# Patient Record
Sex: Female | Born: 1960 | Race: White | Hispanic: No | Marital: Married | Smoking: Former smoker
Health system: Southern US, Community
[De-identification: ages and names within clinical notes are randomized; demographics above are authoritative.]

## PROBLEM LIST (undated history)

## (undated) DIAGNOSIS — E7800 Pure hypercholesterolemia, unspecified: Secondary | ICD-10-CM

## (undated) DIAGNOSIS — R011 Cardiac murmur, unspecified: Secondary | ICD-10-CM

## (undated) DIAGNOSIS — F419 Anxiety disorder, unspecified: Secondary | ICD-10-CM

## (undated) HISTORY — PX: OTHER SURGICAL HISTORY: SHX169

## (undated) HISTORY — DX: Cardiac murmur, unspecified: R01.1

## (undated) HISTORY — DX: Anxiety disorder, unspecified: F41.9

## (undated) HISTORY — PX: COLONOSCOPY: SHX174

## (undated) HISTORY — DX: Pure hypercholesterolemia, unspecified: E78.00

---

## 2020-03-14 ENCOUNTER — Other Ambulatory Visit: Payer: Self-pay | Admitting: Internal Medicine

## 2020-03-14 DIAGNOSIS — Z13820 Encounter for screening for osteoporosis: Secondary | ICD-10-CM

## 2020-10-16 ENCOUNTER — Other Ambulatory Visit (HOSPITAL_BASED_OUTPATIENT_CLINIC_OR_DEPARTMENT_OTHER): Payer: Self-pay | Admitting: Obstetrics & Gynecology

## 2020-10-16 DIAGNOSIS — Z1231 Encounter for screening mammogram for malignant neoplasm of breast: Secondary | ICD-10-CM

## 2020-10-25 ENCOUNTER — Ambulatory Visit (HOSPITAL_BASED_OUTPATIENT_CLINIC_OR_DEPARTMENT_OTHER)
Admission: RE | Admit: 2020-10-25 | Discharge: 2020-10-25 | Disposition: A | Discharge status: Home / Self Care | Payer: 59 | Source: Ambulatory Visit | Attending: Obstetrics & Gynecology | Admitting: Obstetrics & Gynecology

## 2020-10-25 ENCOUNTER — Other Ambulatory Visit: Payer: Self-pay

## 2020-10-25 DIAGNOSIS — Z1231 Encounter for screening mammogram for malignant neoplasm of breast: Secondary | ICD-10-CM | POA: Diagnosis present

## 2021-01-11 ENCOUNTER — Other Ambulatory Visit (HOSPITAL_COMMUNITY)
Admission: RE | Admit: 2021-01-11 | Discharge: 2021-01-11 | Disposition: A | Discharge status: Home / Self Care | Payer: 59 | Source: Ambulatory Visit | Attending: Obstetrics & Gynecology | Admitting: Obstetrics & Gynecology

## 2021-01-11 ENCOUNTER — Other Ambulatory Visit: Payer: Self-pay

## 2021-01-11 ENCOUNTER — Ambulatory Visit (HOSPITAL_BASED_OUTPATIENT_CLINIC_OR_DEPARTMENT_OTHER): Payer: 59 | Admitting: Obstetrics & Gynecology

## 2021-01-11 ENCOUNTER — Encounter (HOSPITAL_BASED_OUTPATIENT_CLINIC_OR_DEPARTMENT_OTHER): Payer: Self-pay | Admitting: Obstetrics & Gynecology

## 2021-01-11 VITALS — BP 137/91 | HR 60 | Ht 63.25 in | Wt 129.0 lb

## 2021-01-11 DIAGNOSIS — E2839 Other primary ovarian failure: Secondary | ICD-10-CM | POA: Diagnosis not present

## 2021-01-11 DIAGNOSIS — Z124 Encounter for screening for malignant neoplasm of cervix: Secondary | ICD-10-CM

## 2021-01-11 DIAGNOSIS — N952 Postmenopausal atrophic vaginitis: Secondary | ICD-10-CM

## 2021-01-11 DIAGNOSIS — Z780 Asymptomatic menopausal state: Secondary | ICD-10-CM

## 2021-01-11 DIAGNOSIS — Z01419 Encounter for gynecological examination (general) (routine) without abnormal findings: Secondary | ICD-10-CM

## 2021-01-11 DIAGNOSIS — L719 Rosacea, unspecified: Secondary | ICD-10-CM | POA: Insufficient documentation

## 2021-01-11 NOTE — Patient Instructions (Addendum)
Could you get me the dates for your shingles vaccination, Tdap, and Covid vaccination?  Thank you.  Adults need to be tested for Hepatitis C and HIV completed.    Raveree

## 2021-01-11 NOTE — Progress Notes (Signed)
60 y.o. G2P0 Married White or Caucasian female here for annual exam/new patient.  Moved to [place] two years ago.  Husband worked with Pensions [employer] and [employer] and just retired.    Denies vaginal bleeding.    PCP:  Dr. Jacalyn Lefevre.  Last blood work was about a year ago.  Apt due in August.   No LMP recorded. Patient is postmenopausal.          Sexually active: Yes.    The current method of family planning is post menopausal status.    Exercising: yes, elliptical and weights   Smoker:  no  Health Maintenance: Pap:  Obtained today History of abnormal Pap:  no MMG:  10/2020 Colonoscopy:  Will get release BMD:   Order placed TDaP:  She is going to see if has date for last one Shingrix:   completed Hep C testing: recommended Screening Labs: Dr. Jacalyn Lefevre   reports that she has quit smoking. She has never used smokeless tobacco. She reports previous alcohol use. She reports that she does not use drugs.  Past Medical History:  Diagnosis Date  . High cholesterol     Past Surgical History:  Procedure Laterality Date  . Birth mark removal    . Ovarian cyst removal      Current Outpatient Medications  Medication Sig Dispense Refill  . PAROXETINE HCL PO Take 15 mg by mouth.     No current facility-administered medications for this visit.    Family History  Problem Relation Age of Onset  . Hyperlipidemia Mother   . Hyperlipidemia Father   . Cancer Father   . Melanoma Father   . Prostate cancer Father     Review of Systems  All other systems reviewed and are negative.  Exam:   BP (!) 137/91   Pulse 60   Ht 5' 3.25" (1.607 m)   Wt 129 lb (58.5 kg)   BMI 22.67 kg/m   Height: 5' 3.25" (160.7 cm)  General appearance: alert, cooperative and appears stated age Head: Normocephalic, without obvious abnormality, atraumatic Neck: no adenopathy, supple, symmetrical, trachea midline and thyroid normal to inspection and palpation Lungs: clear to auscultation bilaterally Breasts: normal appearance,  no masses or tenderness Heart: regular rate and rhythm Abdomen: soft, non-tender; bowel sounds normal; no masses,  no organomegaly Extremities: extremities normal, atraumatic, no cyanosis or edema Skin: Skin color, texture, turgor normal. No rashes or lesions Lymph nodes: Cervical, supraclavicular, and axillary nodes normal. No abnormal inguinal nodes palpated Neurologic: Grossly normal   Pelvic: External genitalia:  no lesions              Urethra:  normal appearing urethra with no masses, tenderness or lesions              Bartholins and Skenes: normal                 Vagina: atrophic tissue, no discharge, no lesions              Cervix: no lesions              Pap taken: No. Bimanual Exam:  Uterus:  normal size, contour, position, consistency, mobility, non-tender              Adnexa: normal adnexa and no mass, fullness, tenderness               Rectovaginal: Confirms               Anus:  normal sphincter  tone, no lesions  Chaperone, Octaviano Batty, CMA, was present for exam.  Assessment/Plan: 1. Well woman exam with routine gynecological exam - pap smear with HR HPV obtained today - MMG 10/2020 - BMD order placed - release for colonoscopy signed - vaccines reviewed.  Pt is going to try and get me dates of vaccinations if she can find - lab work done with Dr. Jacalyn Lefevre.  Pt aware I do not have access to these results as office is on different EMR.  2. Hypoestrogenism - DG BONE DENSITY (DXA); Future  3. Vaginal atrophy - treatment options reviewed.  Pt not interested in any prescription options at this time but may try some OTC choices.

## 2021-01-12 DIAGNOSIS — E2839 Other primary ovarian failure: Secondary | ICD-10-CM | POA: Insufficient documentation

## 2021-01-12 DIAGNOSIS — Z780 Asymptomatic menopausal state: Secondary | ICD-10-CM | POA: Insufficient documentation

## 2021-01-12 DIAGNOSIS — N952 Postmenopausal atrophic vaginitis: Secondary | ICD-10-CM | POA: Insufficient documentation

## 2021-01-12 LAB — CYTOLOGY - PAP
Comment: NEGATIVE
Diagnosis: NEGATIVE
High risk HPV: NEGATIVE

## 2021-01-17 ENCOUNTER — Encounter (HOSPITAL_BASED_OUTPATIENT_CLINIC_OR_DEPARTMENT_OTHER): Payer: Self-pay

## 2021-04-12 ENCOUNTER — Other Ambulatory Visit: Payer: Self-pay | Admitting: Internal Medicine

## 2021-04-12 DIAGNOSIS — Z13820 Encounter for screening for osteoporosis: Secondary | ICD-10-CM

## 2021-05-07 ENCOUNTER — Encounter: Payer: Self-pay | Admitting: Internal Medicine

## 2021-05-25 ENCOUNTER — Ambulatory Visit: Payer: Self-pay

## 2021-05-25 ENCOUNTER — Ambulatory Visit: Payer: 59 | Admitting: Orthopedic Surgery

## 2021-05-25 ENCOUNTER — Other Ambulatory Visit: Payer: Self-pay

## 2021-05-25 DIAGNOSIS — M25561 Pain in right knee: Secondary | ICD-10-CM

## 2021-05-26 ENCOUNTER — Encounter: Payer: Self-pay | Admitting: Orthopedic Surgery

## 2021-05-26 NOTE — Progress Notes (Signed)
Office Visit Note   Patient: Morgan Jones           Date of Birth: Apr 11, 1961           MRN: [id number] Visit Date: 05/25/2021 Requested by: Michael Boston, MD [address] [place],  [place] [address] PCP: Michael Boston, MD  Subjective: Chief Complaint  Patient presents with   Right Knee - Pain    HPI: [name] is an active 60 year old patient with right knee pain.  This is been going on for 2 weeks.  Started about 3 weeks ago.  Had right knee hyperextension/collapse type injury.  Could only walk by keeping her leg straight.  Overall the knee has gradually improved in terms of flexion and ambulatory ability.  She did play tennis on Monday and Tuesday of this week but really had difficulty running.  Localizes the pain posterior and lateral.  Denies any swelling or mechanical symptoms or instability.  Takes Advil occasionally.  She has been able to go to the gym 2 or 3 times since the injury and is able to do the elliptical.  It is difficult for her to run and pivot.              ROS: All systems reviewed are negative as they relate to the chief complaint within the history of present illness.  Patient denies  fevers or chills.   Assessment & Plan: Visit Diagnoses:  1. Acute pain of right knee     Plan: Impression is right knee pain with normal radiographs no effusion on exam stable collateral cruciate ligaments and no Baker's cyst ruptured or intact by ultrasound examination.  In general I think she has had some type of event which has injured her knee.  Structurally the knee looks to be intact and it is improving.  I would favor continued observation with gradual return to regular activity avoiding loaded flexion in the knee beyond 90 degrees.  If symptoms have not improved by 50 to 75% in another 3 weeks I think MRI scan would be indicated.  She will call to get that set up if that is the case.  Follow-Up Instructions: No follow-ups on file.   Orders:  Orders Placed This Encounter  Procedures    XR KNEE 3 VIEW RIGHT   No orders of the defined types were placed in this encounter.     Procedures: No procedures performed   Clinical Data: No additional findings.  Objective: Vital Signs: There were no vitals taken for this visit.  Physical Exam:   Constitutional: Patient appears well-developed HEENT:  Head: Normocephalic Eyes:EOM are normal Neck: Normal range of motion Cardiovascular: Normal rate Pulmonary/chest: Effort normal Neurologic: Patient is alert Skin: Skin is warm Psychiatric: Patient has normal mood and affect   Ortho Exam: Ortho exam demonstrates full active and passive range of motion of the right knee.  No effusion.  Collateral cruciate ligaments are stable.  No medial or lateral joint line tenderness.  Extensor mechanism is intact.  No groin pain with internal ex rotation of the leg.  Patient has excellent muscle tone in both legs.  Pedal pulses palpable.  No coarse grinding or crepitus with range of motion of the knee.  No Baker's cyst by ultrasound examination in the posterior aspect of the knee.  Specialty Comments:  No specialty comments available.  Imaging: No results found.   PMFS History: Patient Active Problem List   Diagnosis Date Noted   Vaginal atrophy 01/12/2021  Postmenopausal 01/12/2021   Rosacea 01/11/2021   Past Medical History:  Diagnosis Date   High cholesterol     Family History  Problem Relation Age of Onset   Hyperlipidemia Mother    Hyperlipidemia Father    Cancer Father    Melanoma Father    Prostate cancer Father     Past Surgical History:  Procedure Laterality Date   Birth mark removal     Ovarian cyst removal     Social History   Occupational History   Not on file  Tobacco Use   Smoking status: Former   Smokeless tobacco: Never  Vaping Use   Vaping Use: Never used  Substance and Sexual Activity   Alcohol use: Not Currently   Drug use: Never   Sexual activity: Yes    Birth control/protection:  None

## 2021-05-29 ENCOUNTER — Ambulatory Visit: Payer: 59 | Admitting: Orthopaedic Surgery

## 2021-06-04 ENCOUNTER — Other Ambulatory Visit: Payer: Self-pay

## 2021-06-04 DIAGNOSIS — M25561 Pain in right knee: Secondary | ICD-10-CM

## 2021-06-04 NOTE — Progress Notes (Signed)
MRI of right knee has been ordered per patient's request.

## 2021-06-18 ENCOUNTER — Ambulatory Visit
Admission: RE | Admit: 2021-06-18 | Discharge: 2021-06-18 | Disposition: A | Discharge status: Home / Self Care | Payer: 59 | Source: Ambulatory Visit | Attending: Orthopedic Surgery | Admitting: Orthopedic Surgery

## 2021-06-18 DIAGNOSIS — M25561 Pain in right knee: Secondary | ICD-10-CM

## 2021-06-19 NOTE — Progress Notes (Signed)
I called her will see in am

## 2021-06-20 ENCOUNTER — Other Ambulatory Visit: Payer: Self-pay

## 2021-06-20 ENCOUNTER — Ambulatory Visit: Payer: 59 | Admitting: Orthopedic Surgery

## 2021-06-20 ENCOUNTER — Encounter: Payer: Self-pay | Admitting: Orthopedic Surgery

## 2021-06-20 DIAGNOSIS — S83261D Peripheral tear of lateral meniscus, current injury, right knee, subsequent encounter: Secondary | ICD-10-CM | POA: Diagnosis not present

## 2021-06-20 NOTE — Progress Notes (Signed)
Office Visit Note   Patient: Morgan Jones           Date of Birth: Dec 27, 1960           MRN: [id number] Visit Date: 06/20/2021 Requested by: Michael Boston, MD [address] [place],  [place] [address] PCP: Michael Boston, MD  Subjective: Chief Complaint  Patient presents with   Right Knee - Follow-up    Scan review    HPI: [name] is a 60 year old patient with right knee pain.  She is about 2 months out from an injury which was a hyperextension type injury.  She has improved some but really cannot resume the types of activities that she would like to do.  She reports aggravating type pain in her knee after about 2 miles of walking.  Also reports difficulty going up and down stairs.  Localizes the pain around both the medial and lateral aspect of the knee.  Does not report any instability or locking symptoms.  She has been doing exercises in the gym and is overall a very fit and active person.              ROS: All systems reviewed are negative as they relate to the chief complaint within the history of present illness.  Patient denies  fevers or chills.   Assessment & Plan: Visit Diagnoses:  1. Peripheral tear of lateral meniscus of right knee as current injury, subsequent encounter     Plan: Impression is meniscal capsular separation over about a 2 cm area in the lateral meniscus of the right knee.  No effusion today.  This is a type of meniscal problem that is typically not degenerative in nature but more traumatic in nature.  I think it is most likely associated with her hyperextension injury 2 months ago.  Has not really improved to the point where she can resume her full desired regimen of activities.  Still is having aggravating type symptoms in the knee.  The rest of her knee looks reasonable in terms of ACL PCL and medial meniscus.  Discussed today operative and nonoperative options for this.  There is still fluid between the periphery of the meniscus and the capsule indicating that there  has not been any healing in this area.  I think the meniscocapsular separation is about 90% complete but not fully complete which has given that meniscus some stability.  Regarding treatment observation would be 1 option to see if it improves on its own.  I would not recommend any type of resection based on the fact that this involves essentially the entire meniscal width.  This is 1 that I think would do best with inside-out meniscal repair.  Risk and benefits of that as well as the extended rehabilitation is discussed.  I think in general she could be doing some cutting and pivoting activities around the 3 to 4 months time mark.  Nonweightbearing for the first 2 weeks then weightbearing in extension for the second 2 weeks.  She is going to consider her options but in general she sounds like she wants to get this done relatively quickly after Thanksgiving.  All questions answered.  Follow-Up Instructions: Return if symptoms worsen or fail to improve.   Orders:  No orders of the defined types were placed in this encounter.  No orders of the defined types were placed in this encounter.     Procedures: No procedures performed   Clinical Data: No additional findings.  Objective: Vital Signs:  There were no vitals taken for this visit.  Physical Exam:   Constitutional: Patient appears well-developed HEENT:  Head: Normocephalic Eyes:EOM are normal Neck: Normal range of motion Cardiovascular: Normal rate Pulmonary/chest: Effort normal Neurologic: Patient is alert Skin: Skin is warm Psychiatric: Patient has normal mood and affect   Ortho Exam: Ortho exam demonstrates full active and passive range of motion of the knee on the right-hand side with no effusion.  No groin pain with internal ex rotation of the leg.  No masses lymphadenopathy or skin changes noted in that right leg area.  Ankle dorsiflexion intact.  Pedal pulses intact.  No focal joint line tenderness but in general most of her  pain is primarily posterior lateral as opposed to posterior medial.  Specialty Comments:  No specialty comments available.  Imaging: No results found.   PMFS History: Patient Active Problem List   Diagnosis Date Noted   Vaginal atrophy 01/12/2021   Postmenopausal 01/12/2021   Rosacea 01/11/2021   Past Medical History:  Diagnosis Date   High cholesterol     Family History  Problem Relation Age of Onset   Hyperlipidemia Mother    Hyperlipidemia Father    Cancer Father    Melanoma Father    Prostate cancer Father     Past Surgical History:  Procedure Laterality Date   Birth mark removal     Ovarian cyst removal     Social History   Occupational History   Not on file  Tobacco Use   Smoking status: Former   Smokeless tobacco: Never  Vaping Use   Vaping Use: Never used  Substance and Sexual Activity   Alcohol use: Not Currently   Drug use: Never   Sexual activity: Yes    Birth control/protection: None

## 2021-06-28 ENCOUNTER — Encounter: Payer: Self-pay | Admitting: Internal Medicine

## 2021-07-11 ENCOUNTER — Encounter: Payer: 59 | Admitting: Internal Medicine

## 2021-07-16 ENCOUNTER — Encounter: Payer: 59 | Admitting: Internal Medicine

## 2021-07-22 HISTORY — PX: MENISCUS REPAIR: SHX5179

## 2021-07-23 ENCOUNTER — Other Ambulatory Visit: Payer: Self-pay | Admitting: Surgical

## 2021-07-23 DIAGNOSIS — S83281A Other tear of lateral meniscus, current injury, right knee, initial encounter: Secondary | ICD-10-CM

## 2021-07-23 MED ORDER — CELECOXIB 100 MG PO CAPS: 100.0000 mg | ORAL_CAPSULE | Freq: Two times a day (BID) | ORAL | 0 refills | Status: DC

## 2021-07-23 MED ORDER — METHOCARBAMOL 500 MG PO TABS: 500.0000 mg | ORAL_TABLET | Freq: Three times a day (TID) | ORAL | 0 refills | Status: DC | PRN

## 2021-07-23 MED ORDER — ASPIRIN 81 MG PO CHEW: 81.0000 mg | CHEWABLE_TABLET | Freq: Every day | ORAL | 0 refills | Status: AC

## 2021-07-23 MED ORDER — HYDROCODONE-ACETAMINOPHEN 5-325 MG PO TABS: 1.0000 | ORAL_TABLET | ORAL | 0 refills | Status: DC | PRN

## 2021-07-30 ENCOUNTER — Ambulatory Visit (INDEPENDENT_AMBULATORY_CARE_PROVIDER_SITE_OTHER): Payer: 59 | Admitting: Orthopedic Surgery

## 2021-07-30 ENCOUNTER — Other Ambulatory Visit: Payer: Self-pay

## 2021-07-30 DIAGNOSIS — S83261D Peripheral tear of lateral meniscus, current injury, right knee, subsequent encounter: Secondary | ICD-10-CM

## 2021-08-02 ENCOUNTER — Encounter: Payer: Self-pay | Admitting: Orthopedic Surgery

## 2021-08-02 NOTE — Progress Notes (Signed)
° °  Post-Op Visit Note   Patient: Morgan Jones           Date of Birth: 1961-06-05           MRN: [id number] Visit Date: 07/30/2021 PCP: Michael Boston, MD   Assessment & Plan:  Chief Complaint:  Chief Complaint  Patient presents with   Right Knee - Routine Post Op   Visit Diagnoses:  1. Peripheral tear of lateral meniscus of right knee as current injury, subsequent encounter     Plan: Morgan Jones is a 60 year old patient who is now a week out right knee arthroscopy with lateral meniscal repair inside out.  Not taking too much pain medicine.  She has been nonweightbearing with the leg in extension.  Leg moves easily today.  Only trace effusion.  No calf tenderness with negative Homans.  Plan is DC sutures and Steri-Strip.  Continue with aspirin daily.  Okay to weight-bear in extension.  No flexion past 90.  Photos from surgery reviewed.  She is going to transition from nonweightbearing to weightbearing in extension.  She has enough quad strength that she could choose to weight-bear without the knee immobilizer likely after a week of transitional weightbearing.  Follow-up in about 2 weeks for clinical recheck.  Follow-Up Instructions: No follow-ups on file.   Orders:  No orders of the defined types were placed in this encounter.  No orders of the defined types were placed in this encounter.   Imaging: No results found.  PMFS History: Patient Active Problem List   Diagnosis Date Noted   Vaginal atrophy 01/12/2021   Postmenopausal 01/12/2021   Rosacea 01/11/2021   Past Medical History:  Diagnosis Date   High cholesterol     Family History  Problem Relation Age of Onset   Hyperlipidemia Mother    Hyperlipidemia Father    Cancer Father    Melanoma Father    Prostate cancer Father     Past Surgical History:  Procedure Laterality Date   Birth mark removal     Ovarian cyst removal     Social History   Occupational History   Not on file  Tobacco Use   Smoking status: Former    Smokeless tobacco: Never  Vaping Use   Vaping Use: Never used  Substance and Sexual Activity   Alcohol use: Not Currently   Drug use: Never   Sexual activity: Yes    Birth control/protection: None

## 2021-08-09 ENCOUNTER — Ambulatory Visit (INDEPENDENT_AMBULATORY_CARE_PROVIDER_SITE_OTHER): Payer: 59 | Admitting: Orthopedic Surgery

## 2021-08-09 ENCOUNTER — Other Ambulatory Visit: Payer: Self-pay

## 2021-08-09 DIAGNOSIS — S83261D Peripheral tear of lateral meniscus, current injury, right knee, subsequent encounter: Secondary | ICD-10-CM

## 2021-08-13 ENCOUNTER — Encounter: Payer: Self-pay | Admitting: Orthopedic Surgery

## 2021-08-13 NOTE — Progress Notes (Signed)
° °  Post-Op Visit Note   Patient: Cleva Camero           Date of Birth: 10-23-1960           MRN: [id number] Visit Date: 08/09/2021 PCP: Michael Boston, MD   Assessment & Plan:  Chief Complaint:  Chief Complaint  Patient presents with   Right Knee - Routine Post Op   Visit Diagnoses:  1. Peripheral tear of lateral meniscus of right knee as current injury, subsequent encounter     Plan: Jimia is a 60 year old patient who is now 3 weeks out right knee meniscal repair on the lateral side.  She has been weightbearing in the knee immobilizer.  On exam no calf tenderness negative Homans.  No effusion.  Incision looks very good.  At this time I want her to discontinue the immobilizer on Sunday.  Okay for flexion but not beyond 90 degrees.  3-week return to initiate formal physical therapy for quad strengthening.  That will be her 6-week mark and we will start having her do more resistive exercises  while avoiding loaded knee flexion past 90 degrees of flexion.  Follow-Up Instructions: Return in about 3 weeks (around 08/30/2021).   Orders:  No orders of the defined types were placed in this encounter.  No orders of the defined types were placed in this encounter.   Imaging: No results found.  PMFS History: Patient Active Problem List   Diagnosis Date Noted   Vaginal atrophy 01/12/2021   Postmenopausal 01/12/2021   Rosacea 01/11/2021   Past Medical History:  Diagnosis Date   High cholesterol     Family History  Problem Relation Age of Onset   Hyperlipidemia Mother    Hyperlipidemia Father    Cancer Father    Melanoma Father    Prostate cancer Father     Past Surgical History:  Procedure Laterality Date   Birth mark removal     Ovarian cyst removal     Social History   Occupational History   Not on file  Tobacco Use   Smoking status: Former   Smokeless tobacco: Never  Vaping Use   Vaping Use: Never used  Substance and Sexual Activity   Alcohol use: Not Currently    Drug use: Never   Sexual activity: Yes    Birth control/protection: None

## 2021-08-19 ENCOUNTER — Other Ambulatory Visit: Payer: Self-pay | Admitting: Surgical

## 2021-09-03 ENCOUNTER — Encounter: Payer: Self-pay | Admitting: Orthopedic Surgery

## 2021-09-03 ENCOUNTER — Ambulatory Visit (INDEPENDENT_AMBULATORY_CARE_PROVIDER_SITE_OTHER): Payer: 59 | Admitting: Orthopedic Surgery

## 2021-09-03 ENCOUNTER — Other Ambulatory Visit: Payer: Self-pay

## 2021-09-03 DIAGNOSIS — S83261D Peripheral tear of lateral meniscus, current injury, right knee, subsequent encounter: Secondary | ICD-10-CM

## 2021-09-03 DIAGNOSIS — M25561 Pain in right knee: Secondary | ICD-10-CM

## 2021-09-09 ENCOUNTER — Encounter: Payer: Self-pay | Admitting: Orthopedic Surgery

## 2021-09-09 NOTE — Therapy (Signed)
OUTPATIENT PHYSICAL THERAPY LOWER EXTREMITY EVALUATION   Patient Name: Morgan Jones MRN: [id number] DOB:16-Oct-1960, 61 y.o., female Today's Date: 09/10/2021   PT End of Session - 09/10/21 0938     Visit Number 1    Number of Visits 17    Date for PT Re-Evaluation 11/09/21    Authorization Type [employer]    PT Start Time [phone]    PT Stop Time 1026    PT Time Calculation (min) 50 min    Activity Tolerance Patient tolerated treatment well    Behavior During Therapy [facility] for tasks assessed/performed             Past Medical History:  Diagnosis Date   High cholesterol    Past Surgical History:  Procedure Laterality Date   Birth mark removal     MENISCUS REPAIR Right 07/22/2021   Ovarian cyst removal     Patient Active Problem List   Diagnosis Date Noted   Vaginal atrophy 01/12/2021   Postmenopausal 01/12/2021   Rosacea 01/11/2021    PCP: Michael Boston, MD  REFERRING PROVIDER: Donella Stade, PA-C  REFERRING DIAG: [phone] (ICD-10-CM) - Peripheral tear of lateral meniscus of right knee as current injury, subsequent encounter M25.561 (ICD-10-CM) - Acute pain of right knee   THERAPY DIAG:  Peripheral tear of lateral meniscus of right knee as current injury, subsequent encounter  Acute pain of right knee  ONSET DATE: 07/23/21- surgery  SUBJECTIVE:   SUBJECTIVE STATEMENT: Still lacking some mobility and pretty tender. Was told to avoid side stepping and twisting. Want to get back to tennis.   PERTINENT HISTORY: none  PAIN:  Are you having pain? No NPRS scale: up to 4/10 Pain location: knee Pain orientation: Right  PAIN TYPE: uncomfortable Aggravating factors: picking up grand daughter and walking around with her Relieving factors: ice as needed, tylenol prn  PRECAUTIONS: no loading at greater than 90 flexion  WEIGHT BEARING RESTRICTIONS No  FALLS:  Has patient fallen in last 6 months? 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  PLOF: Independent  PATIENT GOALS tennis, participating grand babies, biking- road cycling, walking   OBJECTIVE:   PATIENT SURVEYS:  FOTO 48  COGNITION:  Overall cognitive status: Within functional limits for tasks assessed     SENSATION:  Not really any N/t  MUSCLE LENGTH: Is good about stretching- mild tightness in Rt post chain compared to Lt as expected   LE AROM/PROM:  A/PROM Right 09/10/2021   Hip flexion    Hip extension    Hip abduction    Hip adduction    Hip internal rotation    Hip external rotation    Knee flexion 106   Knee extension -2   Ankle dorsiflexion    Ankle plantarflexion    Ankle inversion    Ankle eversion     (Blank rows = not tested)  LE MMT:  MMT Right 09/10/2021 Left 09/10/2021  Hip flexion 5/5   Hip extension    Hip abduction 23.9 lb 35 lb  Hip adduction    Hip internal rotation    Hip external rotation    Knee flexion    Knee extension    Ankle dorsiflexion    Ankle plantarflexion    Ankle inversion    Ankle eversion     (Blank rows = not tested)  FUNCTIONAL TESTS:  5TSTS not appropriate to test at eval 2 min walk test: 2 laps & to window SLS: able to hold but unstable and required UE to be abd on rt side, valgus knee positioning  GAIT: Distance walked: [facility] No AD    TODAY'S TREATMENT: Eval: SLS 2 min walk SL hip abd Heel raises Gastroc stretch Reviewed typical gym workout& made minor form recommendations   PATIENT EDUCATION:  Education details: Anatomy of condition, POC, HEP, exercise form/rationale Person educated: Patient Education method: Explanation, Demonstration, Tactile cues, Verbal cues, and Handouts Education comprehension: verbalized understanding, returned demonstration, verbal cues required, tactile cues required, and needs further education   HOME  EXERCISE PROGRAM: [id number]  ASSESSMENT:  CLINICAL IMPRESSION: Patient is a 61 y.o. F who was seen today for physical therapy evaluation and treatment for s/p Rt lateral meniscal repair.  Objective impairments include decreased activity tolerance, decreased balance, decreased endurance, decreased mobility, difficulty walking, decreased ROM, decreased strength, impaired flexibility, improper body mechanics, and pain. These impairments are limiting patient from community activity and physical fitness .  Personal factors including Behavior pattern- past habits from physical activity are also affecting patient's functional outcome. Patient will benefit from skilled PT to address above impairments and improve overall function.  Pt demo some lack of strength in Rt hip compared to Lt resulting in valgus collapse in activities and stress on meniscus. Plans to get back to tennis and road biking- will work to strengthen within limits to allow for meniscus healing. Will do 2/week as she is a member at this gym and has a good program to be working on. Encouraged her to come to me with any questions.   REHAB POTENTIAL: Good  CLINICAL DECISION MAKING: Stable/uncomplicated  EVALUATION COMPLEXITY: Low   GOALS: Goals reviewed with patient? Yes  SHORT TERM GOALS:  STG Name Target Date Goal status  1 Proper form in HEP Baseline: began at eval 10/01/2021 INITIAL  2 Able to demo stairs ascending & descending without compensation Baseline: tried for the first time at eval 10/01/2021 INITIAL  3 Knee flexion to 120 Baseline:see chart 10/01/2021 INITIAL   LONG TERM GOALS:   LTG Name Target Date Goal status  1 Pt will return to road cycling Baseline: 11/09/21  INITIAL  2 Pt will begin her return to tennis with knowledge of long term sport-specific training Baseline: 11/09/21 INITIAL  3 Lt hip abd to be at least 95% of Rt Baseline: see chart 11/09/21 INITIAL  4 FOTO to 78 Baseline: 48 at eval 11/09/21  INITIAL   PLAN: PT FREQUENCY: 2x/week  PT DURATION: 8 weeks  PLANNED INTERVENTIONS: Therapeutic exercises, Therapeutic activity, Neuro Muscular re-education, Balance training, Gait training, Patient/Family education, Joint mobilization, Stair training, Aquatic Therapy, Dry Needling, Electrical stimulation, Cryotherapy, Moist heat, Taping, and Manual therapy  PLAN FOR NEXT SESSION: how did elliptical go? Recheck SLS balance/form  Raniah Karan C. Donshay Lupinski PT, DPT 09/10/21 1:40 PM

## 2021-09-09 NOTE — Progress Notes (Signed)
° °  Post-Op Visit Note   Patient: Morgan Jones           Date of Birth: [birth year]           MRN: [id number] Visit Date: 09/03/2021 PCP: Michael Boston, MD   Assessment & Plan:  Chief Complaint:  Chief Complaint  Patient presents with   Right Knee - Routine Post Op   Visit Diagnoses:  1. Peripheral tear of lateral meniscus of right knee as current injury, subsequent encounter   2. Acute pain of right knee     Plan: Patient is a 61 year old female who presents s/p right knee lateral meniscal repair.  She states that her pain is steadily improving.  She has not started physical therapy yet.  Taking over-the-counter Tylenol and Advil as needed for pain control.  She has occasional clicking but nothing consistent.  No significant swelling.  Denies any chest pain or shortness of breath.  She has been compliant with not bending her knee past 90 degrees.  She is ambulating well and states that the knee does not give out on her.  On exam she has range of motion from 0 degrees to 95 degrees.  Quad strength rated 5/5.  No effusion noted.  She has very mild joint line tenderness on palpation of the lateral joint line.  Incisions are well-healed.  No calf tenderness significantly.  Negative Homans' sign.  Plan is to allow patient to flex past 90 degrees.  No loaded knee flexion past 90 degrees at this will place a lot of stress on the repaired meniscus.  She will start physical therapy at [facility] well to focus on active and passive range of motion, quadricep strengthening without loaded knee flexion.  She will go 3 times a week for 6 weeks.  Follow-up in 6 weeks for clinical recheck.  Overall she seems to be progressing quite well.  Follow-Up Instructions: No follow-ups on file.   Orders:  Orders Placed This Encounter  Procedures   Ambulatory referral to Physical Therapy   No orders of the defined types were placed in this encounter.   Imaging: No results found.  PMFS History: Patient Active  Problem List   Diagnosis Date Noted   Vaginal atrophy 01/12/2021   Postmenopausal 01/12/2021   Rosacea 01/11/2021   Past Medical History:  Diagnosis Date   High cholesterol     Family History  Problem Relation Age of Onset   Hyperlipidemia Mother    Hyperlipidemia Father    Cancer Father    Melanoma Father    Prostate cancer Father     Past Surgical History:  Procedure Laterality Date   Birth mark removal     Ovarian cyst removal     Social History   Occupational History   Not on file  Tobacco Use   Smoking status: Former   Smokeless tobacco: Never  Vaping Use   Vaping Use: Never used  Substance and Sexual Activity   Alcohol use: Not Currently   Drug use: Never   Sexual activity: Yes    Birth control/protection: None

## 2021-09-10 ENCOUNTER — Other Ambulatory Visit: Payer: Self-pay

## 2021-09-10 ENCOUNTER — Ambulatory Visit (HOSPITAL_BASED_OUTPATIENT_CLINIC_OR_DEPARTMENT_OTHER): Payer: 59 | Attending: Surgical | Admitting: Physical Therapy

## 2021-09-10 ENCOUNTER — Encounter (HOSPITAL_BASED_OUTPATIENT_CLINIC_OR_DEPARTMENT_OTHER): Payer: Self-pay | Admitting: Physical Therapy

## 2021-09-10 DIAGNOSIS — M6281 Muscle weakness (generalized): Secondary | ICD-10-CM | POA: Insufficient documentation

## 2021-09-10 DIAGNOSIS — G8911 Acute pain due to trauma: Secondary | ICD-10-CM | POA: Diagnosis not present

## 2021-09-10 DIAGNOSIS — S83261D Peripheral tear of lateral meniscus, current injury, right knee, subsequent encounter: Secondary | ICD-10-CM | POA: Diagnosis not present

## 2021-09-10 DIAGNOSIS — M25561 Pain in right knee: Secondary | ICD-10-CM | POA: Insufficient documentation

## 2021-09-10 DIAGNOSIS — X58XXXD Exposure to other specified factors, subsequent encounter: Secondary | ICD-10-CM | POA: Diagnosis not present

## 2021-09-12 ENCOUNTER — Ambulatory Visit (HOSPITAL_BASED_OUTPATIENT_CLINIC_OR_DEPARTMENT_OTHER): Payer: 59 | Admitting: Physical Therapy

## 2021-09-12 ENCOUNTER — Encounter (HOSPITAL_BASED_OUTPATIENT_CLINIC_OR_DEPARTMENT_OTHER): Payer: Self-pay | Admitting: Physical Therapy

## 2021-09-12 ENCOUNTER — Other Ambulatory Visit: Payer: Self-pay

## 2021-09-12 DIAGNOSIS — S83261D Peripheral tear of lateral meniscus, current injury, right knee, subsequent encounter: Secondary | ICD-10-CM | POA: Diagnosis not present

## 2021-09-12 DIAGNOSIS — M25561 Pain in right knee: Secondary | ICD-10-CM

## 2021-09-12 NOTE — Therapy (Signed)
OUTPATIENT PHYSICAL THERAPY TREATMENT NOTE   Patient Name: Morgan Jones MRN: [id number] DOB:October 12, 1960, 61 y.o., female Today's Date: 09/12/2021  PCP: Michael Boston, MD REFERRING PROVIDER: Michael Boston, MD   PT End of Session - 09/12/21 [phone]     Visit Number 2    Number of Visits 17    Date for PT Re-Evaluation 11/09/21    Authorization Type UHC    PT Start Time 0930    PT Stop Time 1013    PT Time Calculation (min) 43 min    Activity Tolerance Patient tolerated treatment well    Behavior During Therapy [facility] for tasks assessed/performed             Past Medical History:  Diagnosis Date   High cholesterol    Past Surgical History:  Procedure Laterality Date   Birth mark removal     MENISCUS REPAIR Right 07/22/2021   Ovarian cyst removal     Patient Active Problem List   Diagnosis Date Noted   Vaginal atrophy 01/12/2021   Postmenopausal 01/12/2021   Rosacea 01/11/2021    REFERRING DIAG: L84.536I (ICD-10-CM) - Peripheral tear of lateral meniscus of right knee as current injury, subsequent encounter M25.561 (ICD-10-CM) - Acute pain of right knee   THERAPY DIAG:  Peripheral tear of lateral meniscus of right knee as current injury, subsequent encounter  Acute pain of right knee  PERTINENT HISTORY: none  PRECAUTIONS: no loading >90 deg flexion  SUBJECTIVE: Feeling ok today. Tried stairs but was tired after watching grand baby.  PAIN:  Are you having pain? 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   PLOF: Independent   PATIENT GOALS tennis, participating grand babies, biking- road cycling, walking     OBJECTIVE:    PATIENT SURVEYS:  FOTO 48   COGNITION:          Overall cognitive status: Within functional limits for tasks assessed                        SENSATION:          Not really any N/t   MUSCLE LENGTH: Is good about stretching- mild tightness in Rt post chain compared to Lt as expected     LE AROM/PROM:   A/PROM Right 09/10/2021     Hip flexion      Hip extension      Hip abduction      Hip adduction      Hip internal rotation      Hip external rotation      Knee flexion 106    Knee extension -2    Ankle dorsiflexion      Ankle plantarflexion      Ankle inversion      Ankle eversion       (Blank rows = not tested)   LE MMT:   MMT Right 09/10/2021 Left 09/10/2021  Hip flexion 5/5    Hip extension      Hip abduction 23.9 lb 35 lb  Hip adduction      Hip internal rotation      Hip external rotation      Knee flexion      Knee extension      Ankle dorsiflexion      Ankle plantarflexion      Ankle inversion      Ankle eversion       (Blank rows = not tested)  FUNCTIONAL TESTS:  5TSTS not appropriate to test at eval 2 min walk test: 2 laps & to window SLS: able to hold but unstable and required UE to be abd on rt side, valgus knee positioning   GAIT: Distance walked: [facility] No AD       TODAY'S TREATMENT: 1/25 Eliptical- 5 min flat road 10s heel stretch on step- 5 heel raises x3 Sidelying hip abd, progressed to arcs Sidelying hip circles- large & small Forearm/toes plank- added small step out and ins Prone hip extension with knee flexed Squat to tap table- knee flexion at approx 65 deg visually SLS in mirror  Eval: SLS 2 min walk SL hip abd Heel raises Gastroc stretch Reviewed typical gym workout& made minor form recommendations     PATIENT EDUCATION:  Education details: exercise form/rationale Person educated: Patient Education method: Explanation, Demonstration, Tactile cues, Verbal cues, and Handouts Education comprehension: verbalized understanding, returned demonstration, verbal cues required, tactile cues required, and needs further education     HOME EXERCISE PROGRAM: [id number]   ASSESSMENT:   CLINICAL IMPRESSION: Tactile cues required for form to engage hip abductor group. Mirror and single finger on chair for balance support in SLS and able to correct alignment  with VC. Loaded knee in high squat without complaints of pain.   REHAB POTENTIAL: Good   CLINICAL DECISION MAKING: Stable/uncomplicated   EVALUATION COMPLEXITY: Low     GOALS: Goals reviewed with patient? Yes   SHORT TERM GOALS:   STG Name Target Date Goal status  1 Proper form in HEP Baseline: began at eval 10/01/2021 INITIAL  2 Able to demo stairs ascending & descending without compensation Baseline: tried for the first time at eval 10/01/2021 INITIAL  3 Knee flexion to 120 Baseline:see chart 10/01/2021 INITIAL    LONG TERM GOALS:    LTG Name Target Date Goal status  1 Pt will return to road cycling Baseline: 11/09/21   INITIAL  2 Pt will begin her return to tennis with knowledge of long term sport-specific training Baseline: 11/09/21 INITIAL  3 Lt hip abd to be at least 95% of Rt Baseline: see chart 11/09/21 INITIAL  4 FOTO to 78 Baseline: 48 at eval 11/09/21 INITIAL    PLAN: PT FREQUENCY: 2x/week   PT DURATION: 8 weeks   PLANNED INTERVENTIONS: Therapeutic exercises, Therapeutic activity, Neuro Muscular re-education, Balance training, Gait training, Patient/Family education, Joint mobilization, Stair training, Aquatic Therapy, Dry Needling, Electrical stimulation, Cryotherapy, Moist heat, Taping, and Manual therapy   PLAN FOR NEXT SESSION: review planks, progress depth of squats     Aliena Ghrist C. Jacqualine Weichel PT, DPT 09/12/21 10:15 AM

## 2021-09-17 ENCOUNTER — Ambulatory Visit (HOSPITAL_BASED_OUTPATIENT_CLINIC_OR_DEPARTMENT_OTHER): Payer: 59 | Admitting: Physical Therapy

## 2021-09-17 ENCOUNTER — Encounter (HOSPITAL_BASED_OUTPATIENT_CLINIC_OR_DEPARTMENT_OTHER): Payer: Self-pay | Admitting: Physical Therapy

## 2021-09-17 ENCOUNTER — Other Ambulatory Visit: Payer: Self-pay

## 2021-09-17 DIAGNOSIS — S83261D Peripheral tear of lateral meniscus, current injury, right knee, subsequent encounter: Secondary | ICD-10-CM | POA: Diagnosis not present

## 2021-09-17 DIAGNOSIS — M25561 Pain in right knee: Secondary | ICD-10-CM

## 2021-09-17 NOTE — Therapy (Signed)
OUTPATIENT PHYSICAL THERAPY TREATMENT NOTE   Patient Name: Morgan Jones MRN: [id number] DOB:[birth year], 61 y.o., female Today's Date: 09/17/2021  PCP: Michael Boston, MD REFERRING PROVIDER: Michael Boston, MD   PT End of Session - 09/17/21 [phone]     Visit Number 3    Number of Visits 17    Date for PT Re-Evaluation 11/09/21    Authorization Type UHC    PT Start Time 0932    PT Stop Time 1015    PT Time Calculation (min) 43 min    Activity Tolerance Patient tolerated treatment well    Behavior During Therapy [facility] for tasks assessed/performed              Past Medical History:  Diagnosis Date   High cholesterol    Past Surgical History:  Procedure Laterality Date   Birth mark removal     MENISCUS REPAIR Right 07/22/2021   Ovarian cyst removal     Patient Active Problem List   Diagnosis Date Noted   Vaginal atrophy 01/12/2021   Postmenopausal 01/12/2021   Rosacea 01/11/2021    REFERRING DIAG: R67.893Y (ICD-10-CM) - Peripheral tear of lateral meniscus of right knee as current injury, subsequent encounter M25.561 (ICD-10-CM) - Acute pain of right knee   THERAPY DIAG:  Peripheral tear of lateral meniscus of right knee as current injury, subsequent encounter  Acute pain of right knee  PERTINENT HISTORY: none  PRECAUTIONS: no loading >90 deg flexion  SUBJECTIVE: Still working on stairs. Do ok going up but stiff coming down  PAIN:  Are you having pain? 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   PLOF: Independent   PATIENT GOALS tennis, participating grand babies, biking- road cycling, walking     OBJECTIVE:    PATIENT SURVEYS:  FOTO 48   COGNITION:          Overall cognitive status: Within functional limits for tasks assessed                        SENSATION:          Not really any N/t   MUSCLE LENGTH: Is good about stretching- mild tightness in Rt post chain compared to Lt as expected     LE AROM/PROM:   A/PROM Right 09/10/2021    Hip  flexion      Hip extension      Hip abduction      Hip adduction      Hip internal rotation      Hip external rotation      Knee flexion 106    Knee extension -2    Ankle dorsiflexion      Ankle plantarflexion      Ankle inversion      Ankle eversion       (Blank rows = not tested)   LE MMT:   MMT Right 09/10/2021 Left 09/10/2021  Hip flexion 5/5    Hip extension      Hip abduction 23.9 lb 35 lb  Hip adduction      Hip internal rotation      Hip external rotation      Knee flexion      Knee extension      Ankle dorsiflexion      Ankle plantarflexion      Ankle inversion      Ankle eversion       (Blank rows = not tested)  FUNCTIONAL TESTS:  5TSTS not appropriate to test at eval 2 min walk test: 2 laps & to window SLS: able to hold but unstable and required UE to be abd on rt side, valgus knee positioning   GAIT: Distance walked: [facility] No AD       TODAY'S TREATMENT: 1/30  Eliptical- 5 min flat  Sit<>stand, ball bw kees; knee 80 deg flexion  SLS with head turns on airex  Single leg eccentric step down- 4", 2" with chair & mirror for Rt stance  Supine: SLR neutral & ER  Sidelying hip circles Lg & Sm, both directions  Bridge on heels  1/25 Eliptical- 5 min flat road 10s heel stretch on step- 5 heel raises x3 Sidelying hip abd, progressed to arcs Sidelying hip circles- large & small Forearm/toes plank- added small step out and ins Prone hip extension with knee flexed Squat to tap table- knee flexion at approx 65 deg visually SLS in mirror  Eval: SLS 2 min walk SL hip abd Heel raises Gastroc stretch Reviewed typical gym workout& made minor form recommendations     PATIENT EDUCATION:  Education details: exercise form/rationale Person educated: Patient Education method: Explanation, Demonstration, Tactile cues, Verbal cues, and Handouts Education comprehension: verbalized understanding, returned demonstration, verbal cues required, tactile cues  required, and needs further education     HOME EXERCISE PROGRAM: [id number]   ASSESSMENT:   CLINICAL IMPRESSION: Continues to tolerate exercises very well. Progressed depth of loading in knee without increased pain. Required UE assist and visual cues for CKC hip control and will cont to work on this.  REHAB POTENTIAL: Good   CLINICAL DECISION MAKING: Stable/uncomplicated   EVALUATION COMPLEXITY: Low     GOALS: Goals reviewed with patient? Yes   SHORT TERM GOALS:   STG Name Target Date Goal status  1 Proper form in HEP Baseline: began at eval 10/01/2021 INITIAL  2 Able to demo stairs ascending & descending without compensation Baseline: tried for the first time at eval 10/01/2021 INITIAL  3 Knee flexion to 120 Baseline:see chart 10/01/2021 INITIAL    LONG TERM GOALS:    LTG Name Target Date Goal status  1 Pt will return to road cycling Baseline: 11/09/21   INITIAL  2 Pt will begin her return to tennis with knowledge of long term sport-specific training Baseline: 11/09/21 INITIAL  3 Lt hip abd to be at least 95% of Rt Baseline: see chart 11/09/21 INITIAL  4 FOTO to 78 Baseline: 48 at eval 11/09/21 INITIAL    PLAN: PT FREQUENCY: 2x/week   PT DURATION: 8 weeks   PLANNED INTERVENTIONS: Therapeutic exercises, Therapeutic activity, Neuro Muscular re-education, Balance training, Gait training, Patient/Family education, Joint mobilization, Stair training, Aquatic Therapy, Dry Needling, Electrical stimulation, Cryotherapy, Moist heat, Taping, and Manual therapy   PLAN FOR NEXT SESSION: cont CKC proximal control    Kateryn Marasigan C. Macel Yearsley PT, DPT 09/18/21 9:44 AM

## 2021-09-19 ENCOUNTER — Encounter (HOSPITAL_BASED_OUTPATIENT_CLINIC_OR_DEPARTMENT_OTHER): Payer: Self-pay | Admitting: Physical Therapy

## 2021-09-24 ENCOUNTER — Encounter (HOSPITAL_BASED_OUTPATIENT_CLINIC_OR_DEPARTMENT_OTHER): Payer: Self-pay

## 2021-09-24 ENCOUNTER — Encounter (HOSPITAL_BASED_OUTPATIENT_CLINIC_OR_DEPARTMENT_OTHER): Payer: 59 | Admitting: Physical Therapy

## 2021-09-26 ENCOUNTER — Encounter (HOSPITAL_BASED_OUTPATIENT_CLINIC_OR_DEPARTMENT_OTHER): Payer: Self-pay | Admitting: Physical Therapy

## 2021-09-27 ENCOUNTER — Encounter: Payer: Self-pay | Admitting: Gastroenterology

## 2021-09-28 ENCOUNTER — Other Ambulatory Visit: Payer: Self-pay

## 2021-09-28 ENCOUNTER — Ambulatory Visit (HOSPITAL_BASED_OUTPATIENT_CLINIC_OR_DEPARTMENT_OTHER): Payer: 59 | Attending: Surgical | Admitting: Physical Therapy

## 2021-09-28 DIAGNOSIS — S83261D Peripheral tear of lateral meniscus, current injury, right knee, subsequent encounter: Secondary | ICD-10-CM | POA: Insufficient documentation

## 2021-09-28 DIAGNOSIS — M25561 Pain in right knee: Secondary | ICD-10-CM | POA: Insufficient documentation

## 2021-09-28 NOTE — Therapy (Signed)
OUTPATIENT PHYSICAL THERAPY TREATMENT NOTE   Patient Name: Morgan Jones MRN: [id number] DOB:[birth year], 61 y.o., female Today's Date: 09/29/2021  PCP: Michael Boston, MD REFERRING PROVIDER: Michael Boston, MD   PT End of Session - 09/29/21 0805     Visit Number 4    Number of Visits 17    Date for PT Re-Evaluation 11/09/21    Authorization Type UHC    PT Start Time 1400    PT Stop Time 1440    PT Time Calculation (min) 40 min    Activity Tolerance Patient tolerated treatment well    Behavior During Therapy WFL for tasks assessed/performed               Past Medical History:  Diagnosis Date   High cholesterol    Past Surgical History:  Procedure Laterality Date   Birth mark removal     MENISCUS REPAIR Right 07/22/2021   Ovarian cyst removal     Patient Active Problem List   Diagnosis Date Noted   Vaginal atrophy 01/12/2021   Postmenopausal 01/12/2021   Rosacea 01/11/2021    REFERRING DIAG: H29.924Q (ICD-10-CM) - Peripheral tear of lateral meniscus of right knee as current injury, subsequent encounter M25.561 (ICD-10-CM) - Acute pain of right knee   THERAPY DIAG:  Peripheral tear of lateral meniscus of right knee as current injury, subsequent encounter  Acute pain of right knee  PERTINENT HISTORY: none  PRECAUTIONS: no loading >90 deg flexion  SUBJECTIVE: it hurt a little after trying to do step downs on treadmill.   PAIN:  Are you having pain? 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   PLOF: Independent   PATIENT GOALS tennis, participating grand babies, biking- road cycling, walking     OBJECTIVE:    PATIENT SURVEYS:  FOTO 48   COGNITION:          Overall cognitive status: Within functional limits for tasks assessed                        SENSATION:          Not really any N/t   MUSCLE LENGTH: Is good about stretching- mild tightness in Rt post chain compared to Lt as expected     LE AROM/PROM:   A/PROM  Right 09/10/2021  Right 09/28/21  Hip flexion      Hip extension      Hip abduction      Hip adduction      Hip internal rotation      Hip external rotation      Knee flexion 106 118   Knee extension -2    Ankle dorsiflexion      Ankle plantarflexion      Ankle inversion      Ankle eversion       (Blank rows = not tested)   LE MMT:   MMT Right 09/10/2021 Left 09/10/2021  Hip flexion 5/5    Hip extension      Hip abduction 23.9 lb 35 lb  Hip adduction      Hip internal rotation      Hip external rotation      Knee flexion      Knee extension      Ankle dorsiflexion      Ankle plantarflexion      Ankle inversion      Ankle eversion       (Blank rows = not  tested)       FUNCTIONAL TESTS:  5TSTS 09/28/21: 6s 2 min walk test: 2 laps & to window SLS: 09/28/21: able to hold with increased ankle strategy, good proximal control   GAIT: Distance walked: [facility] No AD       TODAY'S TREATMENT: 2/10  Eliptical 15 min prior to appt  Squats pull off of bar- added mirror for VC  Clams yellow tband  Reverse clam from abducted position yellow tband Side step squats Stretches: prone quads with strap, supine HS with strap- center & lateral bias, piriformis stretch Ktape - U under patella for tracking  1/30  Eliptical- 5 min flat  Sit<>stand, ball bw kees; knee 80 deg flexion  SLS with head turns on airex  Single leg eccentric step down- 4", 2" with chair & mirror for Rt stance  Supine: SLR neutral & ER  Sidelying hip circles Lg & Sm, both directions  Bridge on heels       PATIENT EDUCATION:  Education details: exercise form/rationale Person educated: Patient Education method: Explanation, Demonstration, Tactile cues, Verbal cues, and Handouts Education comprehension: verbalized understanding, returned demonstration, verbal cues required, tactile cues required, and needs further education     HOME EXERCISE PROGRAM: [id number]   ASSESSMENT:   CLINICAL  IMPRESSION: Notable improvement in CKC hip abductor control. Patellar tendon fat pad irritation with squats- ktape for tracking decreased discomfort. Progressed sidelying hip abduction challenges for gross strength.   Objective impairments include decreased activity tolerance, decreased balance, decreased endurance, decreased mobility, difficulty walking, decreased ROM, decreased strength, impaired flexibility, improper body mechanics, and pain. These impairments are limiting patient from community activity and physical fitness .  Personal factors including Behavior pattern- past habits from physical activity are also affecting patient's functional outcome. Patient will benefit from skilled PT to address above impairments and improve overall function.  REHAB POTENTIAL: Good   CLINICAL DECISION MAKING: Stable/uncomplicated   EVALUATION COMPLEXITY: Low     GOALS: Goals reviewed with patient? Yes   SHORT TERM GOALS:   STG Name Target Date Goal status  1 Proper form in HEP Baseline: began at eval 10/01/2021 achieved  2 Able to demo stairs ascending & descending without compensation Baseline: difficult descending but good form 10/01/2021 achieved  3 Knee flexion to 120 Baseline:118 today, tight end feel without pain 10/01/2021 ongoing    LONG TERM GOALS:    LTG Name Target Date Goal status  1 Pt will return to road cycling Baseline: 11/09/21   INITIAL  2 Pt will begin her return to tennis with knowledge of long term sport-specific training Baseline: 11/09/21 INITIAL  3 Lt hip abd to be at least 95% of Rt Baseline: see chart 11/09/21 INITIAL  4 FOTO to 78 Baseline: 48 at eval 11/09/21 INITIAL    PLAN: PT FREQUENCY: 2x/week   PT DURATION: 8 weeks   PLANNED INTERVENTIONS: Therapeutic exercises, Therapeutic activity, Neuro Muscular re-education, Balance training, Gait training, Patient/Family education, Joint mobilization, Stair training, Aquatic Therapy, Dry Needling, Electrical  stimulation, Cryotherapy, Moist heat, Taping, and Manual therapy   PLAN FOR NEXT SESSION: cont CKC proximal control    Maelin Kurkowski C. Brithany Whitworth PT, DPT 09/29/21 8:06 AM

## 2021-09-29 ENCOUNTER — Encounter (HOSPITAL_BASED_OUTPATIENT_CLINIC_OR_DEPARTMENT_OTHER): Payer: Self-pay | Admitting: Physical Therapy

## 2021-10-01 ENCOUNTER — Ambulatory Visit (HOSPITAL_BASED_OUTPATIENT_CLINIC_OR_DEPARTMENT_OTHER): Payer: 59 | Admitting: Physical Therapy

## 2021-10-01 ENCOUNTER — Other Ambulatory Visit: Payer: Self-pay

## 2021-10-01 ENCOUNTER — Encounter (HOSPITAL_BASED_OUTPATIENT_CLINIC_OR_DEPARTMENT_OTHER): Payer: Self-pay | Admitting: Physical Therapy

## 2021-10-01 DIAGNOSIS — M25561 Pain in right knee: Secondary | ICD-10-CM

## 2021-10-01 DIAGNOSIS — S83261D Peripheral tear of lateral meniscus, current injury, right knee, subsequent encounter: Secondary | ICD-10-CM | POA: Diagnosis not present

## 2021-10-01 NOTE — Therapy (Signed)
OUTPATIENT PHYSICAL THERAPY TREATMENT NOTE   Patient Name: Morgan Jones MRN: [id number] DOB:12-Feb-1961, 61 y.o., female Today's Date: 10/01/2021  PCP: Michael Boston, MD REFERRING PROVIDER: Michael Boston, MD   PT End of Session - 10/01/21 0933     Visit Number 5    Number of Visits 17    Date for PT Re-Evaluation 11/09/21    Authorization Type [employer]    PT Start Time 0930    PT Stop Time 1010    PT Time Calculation (min) 40 min    Activity Tolerance Patient tolerated treatment well    Behavior During Therapy WFL for tasks assessed/performed               Past Medical History:  Diagnosis Date   High cholesterol    Past Surgical History:  Procedure Laterality Date   Birth mark removal     MENISCUS REPAIR Right 07/22/2021   Ovarian cyst removal     Patient Active Problem List   Diagnosis Date Noted   Vaginal atrophy 01/12/2021   Postmenopausal 01/12/2021   Rosacea 01/11/2021    REFERRING DIAG: S50.539J (ICD-10-CM) - Peripheral tear of lateral meniscus of right knee as current injury, subsequent encounter M25.561 (ICD-10-CM) - Acute pain of right knee   THERAPY DIAG:  Peripheral tear of lateral meniscus of right knee as current injury, subsequent encounter  Acute pain of right knee  PERTINENT HISTORY: none  PRECAUTIONS: no loading >90 deg flexion  SUBJECTIVE: Just a little pain here and there when I wear myself out but I can tell its getting better. Love the tape  PAIN:  Are you having pain? 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   PLOF: Independent   PATIENT GOALS tennis, participating grand babies, biking- road cycling, walking     OBJECTIVE:    PATIENT SURVEYS:  FOTO 48   COGNITION:          Overall cognitive status: Within functional limits for tasks assessed                        SENSATION:          Not really any N/t   MUSCLE LENGTH: Is good about stretching- mild tightness in Rt post chain compared to Lt as  expected     LE AROM/PROM:   A/PROM Right 09/10/2021  Right 09/28/21  Hip flexion      Hip extension      Hip abduction      Hip adduction      Hip internal rotation      Hip external rotation      Knee flexion 106 118   Knee extension -2    Ankle dorsiflexion      Ankle plantarflexion      Ankle inversion      Ankle eversion       (Blank rows = not tested)   LE MMT:   MMT Right 09/10/2021 Left 09/10/2021  Hip flexion 5/5    Hip extension      Hip abduction 23.9 lb 35 lb  Hip adduction      Hip internal rotation      Hip external rotation      Knee flexion      Knee extension      Ankle dorsiflexion      Ankle plantarflexion      Ankle inversion      Ankle eversion       (  Blank rows = not tested)       FUNCTIONAL TESTS:  5TSTS 09/28/21: 6s 2 min walk test: EVAL: 2 laps & to window; 2/13:same distance with improved hip extension and heel strike SLS: 09/28/21: able to hold with increased ankle strategy, good proximal control   GAIT: Distance walked: [facility] No AD       TODAY'S TREATMENT: 2/13  Eliptical 5 min prior to session  Self- application/education of ktape U under patella  2 min walk test for distance  Single leg with upper body twist for cones  Sidelying shuttle press 12+6 bilateral  Heel raise with single lower  Sidelying 90/90 reverse clam  2/10  Eliptical 15 min prior to appt  Squats pull off of bar- added mirror for VC  Clams yellow tband  Reverse clam from abducted position yellow tband Side step squats Stretches: prone quads with strap, supine HS with strap- center & lateral bias, piriformis stretch Ktape - U under patella for tracking  1/30  Eliptical- 5 min flat  Sit<>stand, ball bw kees; knee 80 deg flexion  SLS with head turns on airex  Single leg eccentric step down- 4", 2" with chair & mirror for Rt stance  Supine: SLR neutral & ER  Sidelying hip circles Lg & Sm, both directions  Bridge on heels       PATIENT EDUCATION:   Education details: exercise form/rationale Person educated: Patient Education method: Consulting civil engineer, Demonstration, Tactile cues, Verbal cues, and Handouts Education comprehension: verbalized understanding, returned demonstration, verbal cues required, tactile cues required, and needs further education     HOME EXERCISE PROGRAM: [id number]   ASSESSMENT:   CLINICAL IMPRESSION: Difficulty maintaining a straight knee in single leg eccentric heel raise.   Objective impairments include decreased activity tolerance, decreased balance, decreased endurance, decreased mobility, difficulty walking, decreased ROM, decreased strength, impaired flexibility, improper body mechanics, and pain. These impairments are limiting patient from community activity and physical fitness .  Personal factors including Behavior pattern- past habits from physical activity are also affecting patient's functional outcome. Patient will benefit from skilled PT to address above impairments and improve overall function.  REHAB POTENTIAL: Good   CLINICAL DECISION MAKING: Stable/uncomplicated   EVALUATION COMPLEXITY: Low     GOALS: Goals reviewed with patient? Yes   SHORT TERM GOALS:   STG Name Target Date Goal status  1 Proper form in HEP Baseline: began at eval 10/01/2021 achieved  2 Able to demo stairs ascending & descending without compensation Baseline: difficult descending but good form 10/01/2021 achieved  3 Knee flexion to 120 Baseline:118 today, tight end feel without pain 10/01/2021 ongoing    LONG TERM GOALS:    LTG Name Target Date Goal status  1 Pt will return to road cycling Baseline: 11/09/21   INITIAL  2 Pt will begin her return to tennis with knowledge of long term sport-specific training Baseline: 11/09/21 INITIAL  3 Lt hip abd to be at least 95% of Rt Baseline: see chart 11/09/21 INITIAL  4 FOTO to 78 Baseline: 48 at eval 11/09/21 INITIAL    PLAN: PT FREQUENCY: 2x/week   PT DURATION: 8  weeks   PLANNED INTERVENTIONS: Therapeutic exercises, Therapeutic activity, Neuro Muscular re-education, Balance training, Gait training, Patient/Family education, Joint mobilization, Stair training, Aquatic Therapy, Dry Needling, Electrical stimulation, Cryotherapy, Moist heat, Taping, and Manual therapy   PLAN FOR NEXT SESSION: review squats & lunges, cont CKC SLS, FOTO update    Mete Purdum C. Neddie Steedman PT, DPT 10/01/21 10:10 AM

## 2021-10-03 ENCOUNTER — Other Ambulatory Visit: Payer: Self-pay

## 2021-10-03 ENCOUNTER — Encounter (HOSPITAL_BASED_OUTPATIENT_CLINIC_OR_DEPARTMENT_OTHER): Payer: Self-pay | Admitting: Physical Therapy

## 2021-10-03 ENCOUNTER — Ambulatory Visit (HOSPITAL_BASED_OUTPATIENT_CLINIC_OR_DEPARTMENT_OTHER): Payer: 59 | Admitting: Physical Therapy

## 2021-10-03 DIAGNOSIS — M25561 Pain in right knee: Secondary | ICD-10-CM

## 2021-10-03 DIAGNOSIS — S83261D Peripheral tear of lateral meniscus, current injury, right knee, subsequent encounter: Secondary | ICD-10-CM

## 2021-10-03 NOTE — Therapy (Signed)
OUTPATIENT PHYSICAL THERAPY TREATMENT NOTE   Patient Name: Morgan Jones MRN: [id number] DOB:1960/09/24, 61 y.o., female Today's Date: 10/03/2021  PCP: Michael Boston, MD REFERRING PROVIDER: Michael Boston, MD   PT End of Session - 10/03/21 0927     Visit Number 6    Number of Visits 17    Date for PT Re-Evaluation 11/09/21    Authorization Type [employer]    PT Start Time 0930               Past Medical History:  Diagnosis Date   High cholesterol    Past Surgical History:  Procedure Laterality Date   Birth mark removal     MENISCUS REPAIR Right 07/22/2021   Ovarian cyst removal     Patient Active Problem List   Diagnosis Date Noted   Vaginal atrophy 01/12/2021   Postmenopausal 01/12/2021   Rosacea 01/11/2021    REFERRING DIAG: A63.016W (ICD-10-CM) - Peripheral tear of lateral meniscus of right knee as current injury, subsequent encounter M25.561 (ICD-10-CM) - Acute pain of right knee   THERAPY DIAG:  Peripheral tear of lateral meniscus of right knee as current injury, subsequent encounter  Acute pain of right knee  PERTINENT HISTORY: none  PRECAUTIONS: no loading >90 deg flexion  SUBJECTIVE: need to buy tape. Try to avoid walking with holding my grand baby but it was ok yesterday.  PAIN:  Are you having pain? 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   PLOF: Independent   PATIENT GOALS tennis, participating grand babies, biking- road cycling, walking     OBJECTIVE:    PATIENT SURVEYS:  FOTO EVAL: 48   2/15: 61   COGNITION:          Overall cognitive status: Within functional limits for tasks assessed                        SENSATION:          Not really any N/t   MUSCLE LENGTH: Is good about stretching- mild tightness in Rt post chain compared to Lt as expected     LE AROM/PROM:   A/PROM Right 09/10/2021  Right 09/28/21  Hip flexion      Hip extension      Hip abduction      Hip adduction      Hip internal rotation       Hip external rotation      Knee flexion 106 118   Knee extension -2    Ankle dorsiflexion      Ankle plantarflexion      Ankle inversion      Ankle eversion       (Blank rows = not tested)   LE MMT:   MMT Right 09/10/2021 Left 09/10/2021  Hip flexion 5/5    Hip extension      Hip abduction 23.9 lb 35 lb  Hip adduction      Hip internal rotation      Hip external rotation      Knee flexion      Knee extension      Ankle dorsiflexion      Ankle plantarflexion      Ankle inversion      Ankle eversion       (Blank rows = not tested)       FUNCTIONAL TESTS:  5TSTS 09/28/21: 6s 2 min walk test: EVAL: 2 laps & to window; 2/13:same distance  with improved hip extension and heel strike SLS: 09/28/21: able to hold with increased ankle strategy, good proximal control   GAIT: Distance walked: [facility] No AD       TODAY'S TREATMENT: 2/15  Eliptical 5 in prior to session  Rebounder: SLS fwd & lateral; in mini lunge  SLS ABCs  Lateral lunges with and without step  Reviewed sidelying reverse clam  2/13  Eliptical 5 min prior to session  Self- application/education of ktape U under patella  2 min walk test for distance  Single leg with upper body twist for cones  Sidelying shuttle press 12+6 bilateral  Heel raise with single lower  Sidelying 90/90 reverse clam  2/10  Eliptical 15 min prior to appt  Squats pull off of bar- added mirror for VC  Clams yellow tband  Reverse clam from abducted position yellow tband Side step squats Stretches: prone quads with strap, supine HS with strap- center & lateral bias, piriformis stretch Ktape - U under patella for tracking  1/30  Eliptical- 5 min flat  Sit<>stand, ball bw kees; knee 80 deg flexion  SLS with head turns on airex  Single leg eccentric step down- 4", 2" with chair & mirror for Rt stance  Supine: SLR neutral & ER  Sidelying hip circles Lg & Sm, both directions  Bridge on heels       PATIENT EDUCATION:   Education details: return to sport & trial harder motions Person educated: Patient Education method: Consulting civil engineer, Media planner, Corporate treasurer cues, Verbal cues, and Handouts Education comprehension: verbalized understanding, returned demonstration, verbal cues required, tactile cues required, and needs further education     HOME EXERCISE PROGRAM: [id number]   ASSESSMENT:   CLINICAL IMPRESSION: Will do 2 next week and down to 1/week. Challenged dynamic balance with rebounder, minimal cuing required to avoid valgus. Mirror for VC in lateral lunges, good form without pain. Still has ktape on knee.   Objective impairments include decreased activity tolerance, decreased balance, decreased endurance, decreased mobility, difficulty walking, decreased ROM, decreased strength, impaired flexibility, improper body mechanics, and pain. These impairments are limiting patient from community activity and physical fitness .  Personal factors including Behavior pattern- past habits from physical activity are also affecting patient's functional outcome. Patient will benefit from skilled PT to address above impairments and improve overall function.  REHAB POTENTIAL: Good   CLINICAL DECISION MAKING: Stable/uncomplicated   EVALUATION COMPLEXITY: Low     GOALS: Goals reviewed with patient? Yes   SHORT TERM GOALS:   STG Name Target Date Goal status  1 Proper form in HEP Baseline: began at eval 10/01/2021 achieved  2 Able to demo stairs ascending & descending without compensation Baseline: difficult descending but good form 10/01/2021 achieved  3 Knee flexion to 120 Baseline:118 today, tight end feel without pain 10/01/2021 ongoing    LONG TERM GOALS:    LTG Name Target Date Goal status  1 Pt will return to road cycling Baseline: 11/09/21   INITIAL  2 Pt will begin her return to tennis with knowledge of long term sport-specific training Baseline: 11/09/21 INITIAL  3 Lt hip abd to be at least 95% of  Rt Baseline: see chart 11/09/21 INITIAL  4 FOTO to 78 Baseline: 48 at eval 11/09/21 INITIAL    PLAN: PT FREQUENCY: 2x/week   PT DURATION: 8 weeks   PLANNED INTERVENTIONS: Therapeutic exercises, Therapeutic activity, Neuro Muscular re-education, Balance training, Gait training, Patient/Family education, Joint mobilization, Stair training, Aquatic Therapy, Dry Needling, Electrical stimulation, Cryotherapy, Moist  heat, Taping, and Manual therapy   PLAN FOR NEXT SESSION: outcome after last visit? Consider beginning plyometrics    Kevina Piloto C. Alexus Michael PT, DPT 10/03/21 10:14 AM

## 2021-10-08 ENCOUNTER — Encounter (HOSPITAL_BASED_OUTPATIENT_CLINIC_OR_DEPARTMENT_OTHER): Payer: Self-pay | Admitting: Physical Therapy

## 2021-10-08 ENCOUNTER — Ambulatory Visit (HOSPITAL_BASED_OUTPATIENT_CLINIC_OR_DEPARTMENT_OTHER): Payer: 59 | Admitting: Physical Therapy

## 2021-10-08 ENCOUNTER — Other Ambulatory Visit: Payer: Self-pay

## 2021-10-08 DIAGNOSIS — M25561 Pain in right knee: Secondary | ICD-10-CM

## 2021-10-08 DIAGNOSIS — S83261D Peripheral tear of lateral meniscus, current injury, right knee, subsequent encounter: Secondary | ICD-10-CM

## 2021-10-08 NOTE — Therapy (Signed)
OUTPATIENT PHYSICAL THERAPY TREATMENT NOTE   Patient Name: Morgan Jones MRN: [id number] DOB:1960/10/30, 61 y.o., female Today's Date: 10/08/2021  PCP: Michael Boston, MD REFERRING PROVIDER: Gloriann Loan, PA-C   PT End of Session - 10/08/21 0931     Visit Number 7    Number of Visits 17    Date for PT Re-Evaluation 11/09/21    Authorization Type [employer]    PT Start Time 0927    PT Stop Time 1013    PT Time Calculation (min) 46 min    Activity Tolerance Patient tolerated treatment well    Behavior During Therapy [facility] for tasks assessed/performed               Past Medical History:  Diagnosis Date   High cholesterol    Past Surgical History:  Procedure Laterality Date   Birth mark removal     MENISCUS REPAIR Right 07/22/2021   Ovarian cyst removal     Patient Active Problem List   Diagnosis Date Noted   Vaginal atrophy 01/12/2021   Postmenopausal 01/12/2021   Rosacea 01/11/2021    REFERRING DIAG: W25.852D (ICD-10-CM) - Peripheral tear of lateral meniscus of right knee as current injury, subsequent encounter M25.561 (ICD-10-CM) - Acute pain of right knee   THERAPY DIAG:  Peripheral tear of lateral meniscus of right knee as current injury, subsequent encounter  Acute pain of right knee  PERTINENT HISTORY: none  PRECAUTIONS: no loading >90 deg flexion  SUBJECTIVE: I was feeling it after watching grand baby- did some stairs while holding. Taped my knee myself.   PAIN:  Are you having pain? 1-2/10   a little here and there in Right knee.    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   PLOF: Independent   PATIENT GOALS tennis, participating grand babies, biking- road cycling, walking     OBJECTIVE:    PATIENT SURVEYS:  FOTO EVAL: 48   2/15: 61   COGNITION:          Overall cognitive status: Within functional limits for tasks assessed                        SENSATION:          Not really any N/t   MUSCLE LENGTH: Is good about stretching- mild  tightness in Rt post chain compared to Lt as expected     LE AROM/PROM:   A/PROM Right 09/10/2021  Right 09/28/21  Hip flexion      Hip extension      Hip abduction      Hip adduction      Hip internal rotation      Hip external rotation      Knee flexion 106 118   Knee extension -2    Ankle dorsiflexion      Ankle plantarflexion      Ankle inversion      Ankle eversion       (Blank rows = not tested)   LE MMT:   MMT Right 09/10/2021 Left 09/10/2021 RIGHT/LEFT 10/08/21  Hip flexion 5/5     Hip extension       Hip abduction 23.9 lb 35 lb 31/38.9  Hip adduction       Hip internal rotation       Hip external rotation       Knee flexion       Knee extension       Ankle dorsiflexion  Ankle plantarflexion       Ankle inversion       Ankle eversion        (Blank rows = not tested)       FUNCTIONAL TESTS:  5TSTS 09/28/21: 6s 2 min walk test: EVAL: 2 laps & to window; 2/13:same distance with improved hip extension and heel strike SLS: 09/28/21: able to hold with increased ankle strategy, good proximal control   GAIT: Distance walked: [facility] No AD       TODAY'S TREATMENT: 2/20  Eliptical 5 min prior to appt  Lateral lunges  Bosu- mini squats, fluid & isometric; SLS knee extended and mini squat  Toe walking- 4 directions  TrX single leg diver  2/15  Eliptical 5 in prior to session  Rebounder: SLS fwd & lateral; in mini lunge  SLS ABCs  Lateral lunges with and without step  Reviewed sidelying reverse clam  2/13  Eliptical 5 min prior to session  Self- application/education of ktape U under patella  2 min walk test for distance  Single leg with upper body twist for cones  Sidelying shuttle press 12+6 bilateral  Heel raise with single lower  Sidelying 90/90 reverse clam       PATIENT EDUCATION:  Education details: exercise form/rationale Person educated: Patient Education method: Explanation, Demonstration, Tactile cues, Verbal cues, and  Handouts Education comprehension: verbalized understanding, returned demonstration, verbal cues required, tactile cues required, and needs further education     HOME EXERCISE PROGRAM: [id number]   ASSESSMENT:   CLINICAL IMPRESSION: Notable lack of control in Rt ankle vs left in lateral motions and will cont to work to stabilize. Strength in hip is increasing and now just shy of 80% of opposite leg. Next visit will focus on review of form in HEP prior to decrease to 1/week. Planning to progress to plyometrics when stability has increased.   Objective impairments include decreased activity tolerance, decreased balance, decreased endurance, decreased mobility, difficulty walking, decreased ROM, decreased strength, impaired flexibility, improper body mechanics, and pain. These impairments are limiting patient from community activity and physical fitness .  Personal factors including Behavior pattern- past habits from physical activity are also affecting patient's functional outcome. Patient will benefit from skilled PT to address above impairments and improve overall function.  REHAB POTENTIAL: Good   CLINICAL DECISION MAKING: Stable/uncomplicated   EVALUATION COMPLEXITY: Low     GOALS: Goals reviewed with patient? Yes   SHORT TERM GOALS:   STG Name Target Date Goal status  1 Proper form in HEP Baseline: began at eval 10/01/2021 achieved  2 Able to demo stairs ascending & descending without compensation Baseline: difficult descending but good form 10/01/2021 achieved  3 Knee flexion to 120 Baseline:118 today, tight end feel without pain 10/01/2021 ongoing    LONG TERM GOALS:    LTG Name Target Date Goal status  1 Pt will return to road cycling Baseline: 11/09/21   INITIAL  2 Pt will begin her return to tennis with knowledge of long term sport-specific training Baseline: 11/09/21 INITIAL  3 Lt hip abd to be at least 95% of Rt Baseline: see chart 11/09/21 INITIAL  4 FOTO to  78 Baseline: 48 at eval 11/09/21 INITIAL    PLAN: PT FREQUENCY: 2x/week   PT DURATION: 8 weeks   PLANNED INTERVENTIONS: Therapeutic exercises, Therapeutic activity, Neuro Muscular re-education, Balance training, Gait training, Patient/Family education, Joint mobilization, Stair training, Aquatic Therapy, Dry Needling, Electrical stimulation, Cryotherapy, Moist heat, Taping, and Manual therapy  PLAN FOR NEXT SESSION: review HEP exercises & gym program   Vashawn Ekstein C. Gitty Osterlund PT, DPT 10/08/21 3:11 PM

## 2021-10-10 ENCOUNTER — Other Ambulatory Visit: Payer: Self-pay

## 2021-10-10 ENCOUNTER — Encounter (HOSPITAL_BASED_OUTPATIENT_CLINIC_OR_DEPARTMENT_OTHER): Payer: Self-pay | Admitting: Physical Therapy

## 2021-10-10 ENCOUNTER — Ambulatory Visit (HOSPITAL_BASED_OUTPATIENT_CLINIC_OR_DEPARTMENT_OTHER): Payer: 59 | Admitting: Physical Therapy

## 2021-10-10 DIAGNOSIS — S83261D Peripheral tear of lateral meniscus, current injury, right knee, subsequent encounter: Secondary | ICD-10-CM

## 2021-10-10 DIAGNOSIS — M25561 Pain in right knee: Secondary | ICD-10-CM

## 2021-10-10 NOTE — Therapy (Signed)
OUTPATIENT PHYSICAL THERAPY TREATMENT NOTE   Patient Name: Morgan Jones MRN: [id number] DOB:[birth year], 61 y.o., female Today's Date: 10/10/2021  PCP: Michael Boston, MD REFERRING PROVIDER: Gloriann Loan, PA-C   PT End of Session - 10/10/21 0935     Visit Number 8    Number of Visits 17    Date for PT Re-Evaluation 11/09/21    Authorization Type U[employer]    PT Start Time 0934    PT Stop Time 1013    PT Time Calculation (min) 39 min    Activity Tolerance Patient tolerated treatment well    Behavior During Therapy [facility] for tasks assessed/performed               Past Medical History:  Diagnosis Date   High cholesterol    Past Surgical History:  Procedure Laterality Date   Birth mark removal     MENISCUS REPAIR Right 07/22/2021   Ovarian cyst removal     Patient Active Problem List   Diagnosis Date Noted   Vaginal atrophy 01/12/2021   Postmenopausal 01/12/2021   Rosacea 01/11/2021    REFERRING DIAG: H06.237S (ICD-10-CM) - Peripheral tear of lateral meniscus of right knee as current injury, subsequent encounter M25.561 (ICD-10-CM) - Acute pain of right knee   THERAPY DIAG:  Peripheral tear of lateral meniscus of right knee as current injury, subsequent encounter  Acute pain of right knee  PERTINENT HISTORY: none  PRECAUTIONS: no loading >90 deg flexion  SUBJECTIVE: Felt pretty good after last appointment  PAIN:  Are you having pain? 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   PLOF: Independent   PATIENT GOALS tennis, participating grand babies, biking- road cycling, walking     OBJECTIVE:    PATIENT SURVEYS:  FOTO EVAL: 48   2/15: 61   COGNITION:          Overall cognitive status: Within functional limits for tasks assessed                        SENSATION:          Not really any N/t   MUSCLE LENGTH: Is good about stretching- mild tightness in Rt post chain compared to Lt as expected     LE AROM/PROM:   A/PROM Right 09/10/2021   Right 09/28/21  Hip flexion      Hip extension      Hip abduction      Hip adduction      Hip internal rotation      Hip external rotation      Knee flexion 106 118   Knee extension -2    Ankle dorsiflexion      Ankle plantarflexion      Ankle inversion      Ankle eversion       (Blank rows = not tested)   LE MMT:   MMT Right 09/10/2021 Left 09/10/2021 RIGHT/LEFT 10/08/21  Hip flexion 5/5     Hip extension       Hip abduction 23.9 lb 35 lb 31/38.9  Hip adduction       Hip internal rotation       Hip external rotation       Knee flexion       Knee extension       Ankle dorsiflexion       Ankle plantarflexion       Ankle inversion       Ankle eversion        (  Blank rows = not tested)       FUNCTIONAL TESTS:  5TSTS 09/28/21: 6s 2 min walk test: EVAL: 2 laps & to window; 2/13:same distance with improved hip extension and heel strike SLS: 09/28/21: able to hold with increased ankle strategy, good proximal control   GAIT: Distance walked: [facility] No AD       TODAY'S TREATMENT: 2/22:  Reviewed all current HEP exercises  Added variations: marching to bridges, hop to sit to stand  2/20  Eliptical 5 min prior to appt  Lateral lunges  Bosu- mini squats, fluid & isometric; SLS knee extended and mini squat  Toe walking- 4 directions  TrX single leg diver  2/15  Eliptical 5 in prior to session  Rebounder: SLS fwd & lateral; in mini lunge  SLS ABCs  Lateral lunges with and without step  Reviewed sidelying reverse clam  2/13  Eliptical 5 min prior to session  Self- application/education of ktape U under patella  2 min walk test for distance  Single leg with upper body twist for cones  Sidelying shuttle press 12+6 bilateral  Heel raise with single lower  Sidelying 90/90 reverse clam       PATIENT EDUCATION:  Education details: exercise form/rationale Person educated: Patient Education method: Explanation, Demonstration, Tactile cues, Verbal cues, and  Handouts Education comprehension: verbalized understanding, returned demonstration, verbal cues required, tactile cues required, and needs further education     HOME EXERCISE PROGRAM: [id number]   ASSESSMENT:   CLINICAL IMPRESSION: Reviewed current HEP and added some small variation to 2 exercises for challenge. Decreasing to 1/week for now to allow for strengthening at current level and will progress plyometrics when appropriate.   Objective impairments include decreased activity tolerance, decreased balance, decreased endurance, decreased mobility, difficulty walking, decreased ROM, decreased strength, impaired flexibility, improper body mechanics, and pain. These impairments are limiting patient from community activity and physical fitness .  Personal factors including Behavior pattern- past habits from physical activity are also affecting patient's functional outcome. Patient will benefit from skilled PT to address above impairments and improve overall function.  REHAB POTENTIAL: Good   CLINICAL DECISION MAKING: Stable/uncomplicated   EVALUATION COMPLEXITY: Low     GOALS: Goals reviewed with patient? Yes   SHORT TERM GOALS:   STG Name Target Date Goal status  1 Proper form in HEP Baseline: began at eval 10/01/2021 achieved  2 Able to demo stairs ascending & descending without compensation Baseline: difficult descending but good form 10/01/2021 achieved  3 Knee flexion to 120 Baseline:118 today, tight end feel without pain 10/01/2021 ongoing    LONG TERM GOALS:    LTG Name Target Date Goal status  1 Pt will return to road cycling Baseline: 11/09/21   INITIAL  2 Pt will begin her return to tennis with knowledge of long term sport-specific training Baseline: 11/09/21 INITIAL  3 Lt hip abd to be at least 95% of Rt Baseline: see chart 11/09/21 INITIAL  4 FOTO to 78 Baseline: 48 at eval 11/09/21 INITIAL    PLAN: PT FREQUENCY: 2x/week   PT DURATION: 8 weeks   PLANNED  INTERVENTIONS: Therapeutic exercises, Therapeutic activity, Neuro Muscular re-education, Balance training, Gait training, Patient/Family education, Joint mobilization, Stair training, Aquatic Therapy, Dry Needling, Electrical stimulation, Cryotherapy, Moist heat, Taping, and Manual therapy   PLAN FOR NEXT SESSION: plyometrics   Kennadi Albany C. Tejuan Gholson PT, DPT 10/10/21 10:15 AM

## 2021-10-15 ENCOUNTER — Encounter: Payer: Self-pay | Admitting: Orthopedic Surgery

## 2021-10-15 ENCOUNTER — Other Ambulatory Visit: Payer: Self-pay

## 2021-10-15 ENCOUNTER — Encounter (HOSPITAL_BASED_OUTPATIENT_CLINIC_OR_DEPARTMENT_OTHER): Payer: Self-pay | Admitting: Physical Therapy

## 2021-10-15 ENCOUNTER — Ambulatory Visit (INDEPENDENT_AMBULATORY_CARE_PROVIDER_SITE_OTHER): Payer: 59 | Admitting: Orthopedic Surgery

## 2021-10-15 ENCOUNTER — Ambulatory Visit (HOSPITAL_BASED_OUTPATIENT_CLINIC_OR_DEPARTMENT_OTHER): Payer: 59 | Admitting: Physical Therapy

## 2021-10-15 DIAGNOSIS — S83261D Peripheral tear of lateral meniscus, current injury, right knee, subsequent encounter: Secondary | ICD-10-CM

## 2021-10-15 DIAGNOSIS — M25561 Pain in right knee: Secondary | ICD-10-CM

## 2021-10-15 NOTE — Therapy (Signed)
OUTPATIENT PHYSICAL THERAPY TREATMENT NOTE   Patient Name: Morgan Jones MRN: [id number] DOB:March 02, 1961, 61 y.o., female Today's Date: 10/15/2021  PCP: Michael Boston, MD REFERRING PROVIDER: Gloriann Loan, PA-C   PT End of Session - 10/15/21 0935     Visit Number 9    Number of Visits 17    Date for PT Re-Evaluation 11/09/21    Authorization Type UHC    PT Start Time 0933    PT Stop Time 1014    PT Time Calculation (min) 41 min    Activity Tolerance Patient tolerated treatment well    Behavior During Therapy [facility] for tasks assessed/performed               Past Medical History:  Diagnosis Date   High cholesterol    Past Surgical History:  Procedure Laterality Date   Birth mark removal     MENISCUS REPAIR Right 07/22/2021   Ovarian cyst removal     Patient Active Problem List   Diagnosis Date Noted   Vaginal atrophy 01/12/2021   Postmenopausal 01/12/2021   Rosacea 01/11/2021    REFERRING DIAG: X44.818H (ICD-10-CM) - Peripheral tear of lateral meniscus of right knee as current injury, subsequent encounter M25.561 (ICD-10-CM) - Acute pain of right knee   THERAPY DIAG:  Peripheral tear of lateral meniscus of right knee as current injury, subsequent encounter  Acute pain of right knee  PERTINENT HISTORY: none  PRECAUTIONS: no loading >90 deg flexion  SUBJECTIVE: A little nagging medially today. It feels a little better after working out. Carrying grand daughter and walking is getting better  PAIN:  Are you having pain? 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   PLOF: Independent   PATIENT GOALS tennis, participating grand babies, biking- road cycling, walking     OBJECTIVE:    PATIENT SURVEYS:  FOTO EVAL: 48   2/15: 61   COGNITION:          Overall cognitive status: Within functional limits for tasks assessed                        SENSATION:          Not really any N/t   MUSCLE LENGTH: Is good about  stretching- mild tightness in Rt post chain compared to Lt as expected     LE AROM/PROM:   A/PROM Right 09/10/2021  Right 09/28/21  Hip flexion      Hip extension      Hip abduction      Hip adduction      Hip internal rotation      Hip external rotation      Knee flexion 106 118   Knee extension -2    Ankle dorsiflexion      Ankle plantarflexion      Ankle inversion      Ankle eversion       (Blank rows = not tested)   LE MMT:   MMT Right 09/10/2021 Left 09/10/2021 RIGHT/LEFT 10/08/21  Hip flexion 5/5     Hip extension       Hip abduction 23.9 lb 35 lb 31/38.9  Hip adduction       Hip internal rotation       Hip external rotation       Knee flexion       Knee extension       Ankle dorsiflexion       Ankle plantarflexion  Ankle inversion       Ankle eversion        (Blank rows = not tested)       FUNCTIONAL TESTS:  5TSTS 09/28/21: 6s 2 min walk test: EVAL: 2 laps & to window; 2/13:same distance with improved hip extension and heel strike SLS: 09/28/21: able to hold with increased ankle strategy, good proximal control   GAIT: Distance walked: [facility] No AD       TODAY'S TREATMENT:  2/27:  Step up/down- fwd, lateral, fwd step down  SLS yellow tband arc with turnout  Hops: double lev- shallow & deep, double leg to single leg land  SLS with trunk rotation resisted yellow tband  2/22:  Reviewed all current HEP exercises  Added variations: marching to bridges, hop to sit to stand  2/20  Eliptical 5 min prior to appt  Lateral lunges  Bosu- mini squats, fluid & isometric; SLS knee extended and mini squat  Toe walking- 4 directions  TrX single leg diver         PATIENT EDUCATION:  Education details: exercise form/rationale Person educated: Patient Education method: Explanation, Demonstration, Tactile cues, Verbal cues, and Handouts Education comprehension: verbalized understanding, returned demonstration, verbal cues required, tactile cues required,  and needs further education     HOME EXERCISE PROGRAM: [id number]   ASSESSMENT:   CLINICAL IMPRESSION: Progressed to plyometrics today with excellent tolerance. Cues required for control of pelvis to avoid knee valgus- used mirror for VC.   Objective impairments include decreased activity tolerance, decreased balance, decreased endurance, decreased mobility, difficulty walking, decreased ROM, decreased strength, impaired flexibility, improper body mechanics, and pain. These impairments are limiting patient from community activity and physical fitness .  Personal factors including Behavior pattern- past habits from physical activity are also affecting patient's functional outcome. Patient will benefit from skilled PT to address above impairments and improve overall function.  REHAB POTENTIAL: Good   CLINICAL DECISION MAKING: Stable/uncomplicated   EVALUATION COMPLEXITY: Low     GOALS: Goals reviewed with patient? Yes   SHORT TERM GOALS:   STG Name Target Date Goal status  1 Proper form in HEP Baseline: began at eval 10/01/2021 achieved  2 Able to demo stairs ascending & descending without compensation Baseline: difficult descending but good form 10/01/2021 achieved  3 Knee flexion to 120 Baseline:118 today, tight end feel without pain 10/01/2021 ongoing    LONG TERM GOALS:    LTG Name Target Date Goal status  1 Pt will return to road cycling Baseline: 11/09/21   INITIAL  2 Pt will begin her return to tennis with knowledge of long term sport-specific training Baseline: 11/09/21 INITIAL  3 Lt hip abd to be at least 95% of Rt Baseline: see chart 11/09/21 INITIAL  4 FOTO to 78 Baseline: 48 at eval 11/09/21 INITIAL    PLAN: PT FREQUENCY: 2x/week   PT DURATION: 8 weeks   PLANNED INTERVENTIONS: Therapeutic exercises, Therapeutic activity, Neuro Muscular re-education, Balance training, Gait training, Patient/Family education, Joint mobilization, Stair training, Aquatic Therapy, Dry  Needling, Electrical stimulation, Cryotherapy, Moist heat, Taping, and Manual therapy   PLAN FOR NEXT SESSION: continue plyometrics, 10th visit PN   Valecia Beske C. Mayari Matus PT, DPT 10/15/21 10:15 AM

## 2021-10-15 NOTE — Progress Notes (Signed)
° °  Post-Op Visit Note   Patient: Morgan Jones           Date of Birth: 01-04-1961           MRN: [id number] Visit Date: 10/15/2021 PCP: Michael Boston, MD   Assessment & Plan:  Chief Complaint:  Chief Complaint  Patient presents with   Right Knee - Follow-up    right knee lateral meniscal repair 07/2021   Visit Diagnoses:  1. Peripheral tear of lateral meniscus of right knee as current injury, subsequent encounter     Plan: [name] is a 61 year old active patient who underwent right knee lateral meniscal repair in December.  She is doing okay.  She is 3 months out.  Working up.  Therapy 2 times a week.  On exam she has slightly less than a centimeter atrophy right leg versus left.  She is doing half hour on the elliptical increasing the resistance.  Resistance has gone up from 1-4.  She is also doing core strengthening exercises.  Plan at this time is continue quad strengthening exercises.  6-week return.  No loading greater than 90 degrees with her quad strengthening exercises.  I think she is okay to start gradually getting her way back into tennis starting out with some volleys and cross court play.  Anticipate match play probably within 3 to 4 weeks.  Come back in 6 weeks for clinical recheck.  Follow-Up Instructions: Return in about 6 weeks (around 11/26/2021).   Orders:  No orders of the defined types were placed in this encounter.  No orders of the defined types were placed in this encounter.   Imaging: No results found.  PMFS History: Patient Active Problem List   Diagnosis Date Noted   Vaginal atrophy 01/12/2021   Postmenopausal 01/12/2021   Rosacea 01/11/2021   Past Medical History:  Diagnosis Date   High cholesterol     Family History  Problem Relation Age of Onset   Hyperlipidemia Mother    Hyperlipidemia Father    Cancer Father    Melanoma Father    Prostate cancer Father     Past Surgical History:  Procedure Laterality Date   Birth mark removal      MENISCUS REPAIR Right 07/22/2021   Ovarian cyst removal     Social History   Occupational History   Not on file  Tobacco Use   Smoking status: Former   Smokeless tobacco: Never  Vaping Use   Vaping Use: Never used  Substance and Sexual Activity   Alcohol use: Not Currently   Drug use: Never   Sexual activity: Yes    Birth control/protection: None

## 2021-10-17 ENCOUNTER — Encounter (HOSPITAL_BASED_OUTPATIENT_CLINIC_OR_DEPARTMENT_OTHER): Payer: Self-pay | Admitting: Physical Therapy

## 2021-10-22 ENCOUNTER — Other Ambulatory Visit: Payer: Self-pay

## 2021-10-22 ENCOUNTER — Ambulatory Visit (HOSPITAL_BASED_OUTPATIENT_CLINIC_OR_DEPARTMENT_OTHER): Payer: 59 | Attending: Surgical | Admitting: Physical Therapy

## 2021-10-22 ENCOUNTER — Encounter (HOSPITAL_BASED_OUTPATIENT_CLINIC_OR_DEPARTMENT_OTHER): Payer: Self-pay | Admitting: Physical Therapy

## 2021-10-22 DIAGNOSIS — M25561 Pain in right knee: Secondary | ICD-10-CM | POA: Diagnosis present

## 2021-10-22 DIAGNOSIS — S83261D Peripheral tear of lateral meniscus, current injury, right knee, subsequent encounter: Secondary | ICD-10-CM | POA: Insufficient documentation

## 2021-10-22 NOTE — Therapy (Signed)
?OUTPATIENT PHYSICAL THERAPY TREATMENT NOTE ? ? ?Patient Name: Morgan Jones ?MRN: [id number] ?DOB:May 28, 1961, 61 y.o., female ?Today's Date: 10/22/2021 ? ?PCP: Michael Boston, MD ?REFERRING PROVIDER: Gloriann Loan, PA-C ? ? PT End of Session - 10/22/21 1052   ? ? Visit Number 10   ? Number of Visits 17   ? Date for PT Re-Evaluation 11/09/21   ? Authorization Type [employer]HC   ? PT Start Time 1055   ? PT Stop Time 8270   ? PT Time Calculation (min) 47 min   ? Activity Tolerance Patient tolerated treatment well   ? Behavior During Therapy [facility] for tasks assessed/performed   ? ?  ?  ? ?  ? ? ? ? ?Past Medical History:  ?Diagnosis Date  ? High cholesterol   ? ?Past Surgical History:  ?Procedure Laterality Date  ? Birth mark removal    ? MENISCUS REPAIR Right 07/22/2021  ? Ovarian cyst removal    ? ?Patient Active Problem List  ? Diagnosis Date Noted  ? Vaginal atrophy 01/12/2021  ? Postmenopausal 01/12/2021  ? Rosacea 01/11/2021  ? ? ?REFERRING DIAG: S83.261D (ICD-10-CM) - Peripheral tear of lateral meniscus of right knee as current injury, subsequent encounter M25.561 (ICD-10-CM) - Acute pain of right knee  ? ?THERAPY DIAG:  ?Peripheral tear of lateral meniscus of right knee as current injury, subsequent encounter ? ?Acute pain of right knee ? ?PERTINENT HISTORY: none ? ?PRECAUTIONS: no loading >90 deg flexion ? ?SUBJECTIVE: I took Friday and Saturday off. I had people over and was worn out.  ? ?PAIN:  ?Are you having pain? No ? ? 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  ?PLOF: Independent ?  ?PATIENT GOALS tennis, participating grand babies, biking- road cycling, walking ?  ?  ?OBJECTIVE:  ?  ?PATIENT SURVEYS:  ?FOTO EVAL: 48   2/15: 61 ?  ?COGNITION: ?         Overall cognitive status: Within functional limits for tasks assessed              ?          ?SENSATION: ?         Not really any N/t ?  ?MUSCLE LENGTH: ?Is good about stretching- mild tightness in Rt post chain compared to Lt as expected ?  ?  ?LE AROM/PROM: ?   ?A/PROM Right ?09/10/2021  Right ?09/28/21  ?Hip flexion      ?Hip extension      ?Hip abduction      ?Hip adduction      ?Hip internal rotation      ?Hip external rotation      ?Knee flexion 106 118   ?Knee extension -2    ?Ankle dorsiflexion      ?Ankle plantarflexion      ?Ankle inversion      ?Ankle eversion      ? (Blank rows = not tested) ?  ?LE MMT: ?  ?MMT Right ?09/10/2021 Left ?09/10/2021 RIGHT/LEFT ?10/08/21  ?Hip flexion 5/5     ?Hip extension       ?Hip abduction 23.9 lb 35 lb 31/38.9  ?Hip adduction       ?Hip internal rotation       ?Hip external rotation       ?Knee flexion       ?Knee extension       ?Ankle dorsiflexion       ?Ankle plantarflexion       ?Ankle inversion       ?  Ankle eversion       ? (Blank rows = not tested) ?  ?  ?  ?FUNCTIONAL TESTS:  ?5TSTS 09/28/21: 6s ?2 min walk test: EVAL: 2 laps & to window; 2/13:same distance with improved hip extension and heel strike ?SLS: 09/28/21: able to hold with increased ankle strategy, good proximal control ?  ?GAIT: ?Distance walked: [facility] ?No AD ?  ?  ?  ?TODAY'S TREATMENT: ? ?3/6: ? Reviewed hopping and single leg balance ? 3-way lunge on slider ? Legs together- box jumps ?Lateral hops- single leg ?Bosu- fwd & lateral lunges onto flat surface ?Toe walking- knees extended & bent ?Single leg heel raises ?Brigdgs- +marching, + SLR ?Supine figure 4 ? ?2/27: ? Step up/down- fwd, lateral, fwd step down ? SLS yellow tband arc with turnout ? Hops: double lev- shallow & deep, double leg to single leg land ? SLS with trunk rotation resisted yellow tband ? ?2/22: ? Reviewed all current HEP exercises ? Added variations: marching to bridges, hop to sit to stand ? ?2/20 ? Eliptical 5 min prior to appt ? Lateral lunges ? Bosu- mini squats, fluid & isometric; SLS knee extended and mini squat ? Toe walking- 4 directions ? TrX single leg diver ? ? ? ? ?  ?  ?PATIENT EDUCATION:  ?Education details: exercise form/rationale ?Person educated: Patient ?Education method:  Explanation, Demonstration, Tactile cues, Verbal cues, and Handouts ?Education comprehension: verbalized understanding, returned demonstration, verbal cues required, tactile cues required, and needs further education ?  ?  ?HOME EXERCISE PROGRAM: ?[id number] ?  ?ASSESSMENT: ?  ?CLINICAL IMPRESSION: ?Continued to progress progression into plyometrics with good tolerance. Cues required to slow motions and gain control of balance- tends to lean to the Lt before pushing off to jump from the Rt LE.  ? ?Objective impairments include decreased activity tolerance, decreased balance, decreased endurance, decreased mobility, difficulty walking, decreased ROM, decreased strength, impaired flexibility, improper body mechanics, and pain. These impairments are limiting patient from community activity and physical fitness .  ?Personal factors including Behavior pattern- past habits from physical activity are also affecting patient's functional outcome. Patient will benefit from skilled PT to address above impairments and improve overall function. ? ?REHAB POTENTIAL: Good ?  ?CLINICAL DECISION MAKING: Stable/uncomplicated ?  ?EVALUATION COMPLEXITY: Low ?  ?  ?GOALS: ?Goals reviewed with patient? Yes ?  ?SHORT TERM GOALS: ?  ?STG Name Target Date Goal status  ?1 Proper form in HEP ?Baseline: began at eval 10/01/2021 achieved  ?2 Able to demo stairs ascending & descending without compensation ?Baseline: difficult descending but good form 10/01/2021 achieved  ?3 Knee flexion to 120 ?Baseline:118 today, tight end feel without pain 10/01/2021 ongoing  ?  ?LONG TERM GOALS:  ?  ?LTG Name Target Date Goal status  ?1 Pt will return to road cycling ?Baseline: 11/09/21 ?  INITIAL  ?2 Pt will begin her return to tennis with knowledge of long term sport-specific training ?Baseline: 11/09/21 INITIAL  ?3 Lt hip abd to be at least 95% of Rt ?Baseline: see chart 11/09/21 INITIAL  ?4 FOTO to 78 ?Baseline: 48 at eval 11/09/21 INITIAL  ?  ?PLAN: ?PT FREQUENCY:  2x/week ?  ?PT DURATION: 8 weeks ?  ?PLANNED INTERVENTIONS: Therapeutic exercises, Therapeutic activity, Neuro Muscular re-education, Balance training, Gait training, Patient/Family education, Joint mobilization, Stair training, Aquatic Therapy, Dry Needling, Electrical stimulation, Cryotherapy, Moist heat, Taping, and Manual therapy ?  ?PLAN FOR NEXT SESSION: tennis-specific ? ? ?Marilou Barnfield C. Coulter Oldaker PT, DPT ?10/22/21 12:49  PM ? ? ? ? ? ? ?

## 2021-10-24 ENCOUNTER — Encounter (HOSPITAL_BASED_OUTPATIENT_CLINIC_OR_DEPARTMENT_OTHER): Payer: Self-pay | Admitting: Physical Therapy

## 2021-10-29 ENCOUNTER — Ambulatory Visit (HOSPITAL_BASED_OUTPATIENT_CLINIC_OR_DEPARTMENT_OTHER): Payer: 59 | Admitting: Physical Therapy

## 2021-10-29 ENCOUNTER — Other Ambulatory Visit: Payer: Self-pay

## 2021-10-29 ENCOUNTER — Encounter (HOSPITAL_BASED_OUTPATIENT_CLINIC_OR_DEPARTMENT_OTHER): Payer: Self-pay | Admitting: Physical Therapy

## 2021-10-29 DIAGNOSIS — S83261D Peripheral tear of lateral meniscus, current injury, right knee, subsequent encounter: Secondary | ICD-10-CM | POA: Diagnosis not present

## 2021-10-29 DIAGNOSIS — M25561 Pain in right knee: Secondary | ICD-10-CM

## 2021-10-29 NOTE — Therapy (Signed)
?OUTPATIENT PHYSICAL THERAPY TREATMENT NOTE ? ? ?Patient Name: Morgan Jones ?MRN: [id number] ?DOB:Mar 29, 1961, 61 y.o., female ?Today's Date: 10/29/2021 ? ?PCP: Michael Boston, MD ?REFERRING PROVIDER: Gloriann Loan, PA-C ? ? PT End of Session - 10/29/21 0934   ? ? Visit Number 11   ? Number of Visits 17   ? Date for PT Re-Evaluation 11/09/21   ? Authorization Type [employer]   ? PT Start Time [phone]   ? PT Stop Time 1012   ? PT Time Calculation (min) 39 min   ? Activity Tolerance Patient tolerated treatment well   ? Behavior During Therapy [facility] for tasks assessed/performed   ? ?  ?  ? ?  ? ? ? ? ?Past Medical History:  ?Diagnosis Date  ? High cholesterol   ? ?Past Surgical History:  ?Procedure Laterality Date  ? Birth mark removal    ? MENISCUS REPAIR Right 07/22/2021  ? Ovarian cyst removal    ? ?Patient Active Problem List  ? Diagnosis Date Noted  ? Vaginal atrophy 01/12/2021  ? Postmenopausal 01/12/2021  ? Rosacea 01/11/2021  ? ? ?REFERRING DIAG: S83.261D (ICD-10-CM) - Peripheral tear of lateral meniscus of right knee as current injury, subsequent encounter M25.561 (ICD-10-CM) - Acute pain of right knee  ? ?THERAPY DIAG:  ?Peripheral tear of lateral meniscus of right knee as current injury, subsequent encounter ? ?Acute pain of right knee ? ?PERTINENT HISTORY: none ? ?PRECAUTIONS: no loading >90 deg flexion ? ?SUBJECTIVE: the one-legged squat can tweek my knee on bosu if I am not holding on.  ? ?PAIN:  ?Are you having pain? No ? ? 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  ?PLOF: Independent ?  ?PATIENT GOALS tennis, participating grand babies, biking- road cycling, walking ?  ?  ?OBJECTIVE:  ?  ?PATIENT SURVEYS:  ?FOTO EVAL: 48   2/15: 61  3/13: 70 ?  ?COGNITION: ?         Overall cognitive status: Within functional limits for tasks assessed              ?          ?SENSATION: ?         Not really any N/t ?  ?MUSCLE LENGTH: ?Is good about stretching- mild tightness in Rt post chain compared to Lt as expected ?  ?  ?LE  AROM/PROM: ?  ?A/PROM Right ?09/10/2021  Right ?09/28/21 Right ?3/13  ?Hip flexion       ?Hip extension       ?Hip abduction       ?Hip adduction       ?Hip internal rotation       ?Hip external rotation       ?Knee flexion 106 118  130  ?Knee extension -2     ?Ankle dorsiflexion       ?Ankle plantarflexion       ?Ankle inversion       ?Ankle eversion       ? (Blank rows = not tested) ?  ?LE MMT: ?  ?MMT Right ?09/10/2021 Left ?09/10/2021 RIGHT/LEFT ?10/08/21 RIGHT/LEFT ?10/29/21  ?Hip flexion 5/5      ?Hip extension        ?Hip abduction 23.9 lb 35 lb 31/38.9 33.8/33.7  ?Hip adduction        ?Hip internal rotation        ?Hip external rotation        ?Knee flexion        ?Knee  extension        ?Ankle dorsiflexion        ?Ankle plantarflexion        ?Ankle inversion        ?Ankle eversion        ? (Blank rows = not tested) ?  ?  ?  ?FUNCTIONAL TESTS:  ?5TSTS 09/28/21: 6s ?2 min walk test: EVAL: 2 laps & to window; 2/13:same distance with improved hip extension and heel strike; 3/13: 3 laps lacking 75f ?SLS: 3/13: mild incr in ankle strategy vs Lt but keeps level pelvis and good balance control.  ?  ?GAIT: ?Distance walked: [facility]?No AD ?  ?  ?  ?TODAY'S TREATMENT: ? ?3/13: ?Bosu squats & lunges ?Walking ?Single leg hinge  ?Bridges ?Reviewed HEP & progression of RTS ? ?3/6: ? Reviewed hopping and single leg balance ? 3-way lunge on slider ? Legs together- box jumps ?Lateral hops- single leg ?Bosu- fwd & lateral lunges onto flat surface ?Toe walking- knees extended & bent ?Single leg heel raises ?Brigdgs- +marching, + SLR ?Supine figure 4 ? ?2/27: ? Step up/down- fwd, lateral, fwd step down ? SLS yellow tband arc with turnout ? Hops: double lev- shallow & deep, double leg to single leg land ? SLS with trunk rotation resisted yellow tband ? ?2/22: ? Reviewed all current HEP exercises ? Added variations: marching to bridges, hop to sit to stand ? ? ?  ?PATIENT EDUCATION:  ?Education details: exercise form/rationale, goals  progress ?Person educated: Patient ?Education method: Explanation, Demonstration, Tactile cues, Verbal cues, and Handouts ?Education comprehension: verbalized understanding, returned demonstration, verbal cues required, tactile cues required, and needs further education ?  ?  ?HOME EXERCISE PROGRAM: ?[id number] ?  ?ASSESSMENT: ?  ?CLINICAL IMPRESSION: ?Pt has met all of her goals except her FOTO goal at this point. She is prepared to try high level activities independently and will be placed on hold for 1 month in case she needs any support as she returns to sport.  ? ?Objective impairments include decreased activity tolerance, decreased balance, decreased endurance, decreased mobility, difficulty walking, decreased ROM, decreased strength, impaired flexibility, improper body mechanics, and pain. These impairments are limiting patient from community activity and physical fitness .  ?Personal factors including Behavior pattern- past habits from physical activity are also affecting patient's functional outcome. Patient will benefit from skilled PT to address above impairments and improve overall function. ? ?REHAB POTENTIAL: Good ?  ?CLINICAL DECISION MAKING: Stable/uncomplicated ?  ?EVALUATION COMPLEXITY: Low ?  ?  ?GOALS: ?Goals reviewed with patient? Yes ?  ?SHORT TERM GOALS: ?  ?STG Name Target Date Goal status  ?1 Proper form in HEP ?Baseline: began at eval 10/01/2021 achieved  ?2 Able to demo stairs ascending & descending without compensation ?Baseline: difficult descending but good form 10/01/2021 achieved  ?3 Knee flexion to 120 ?Baseline:118 today, tight end feel without pain 10/01/2021 ongoing  ?  ?LONG TERM GOALS:  ?  ?LTG Name Target Date Goal status  ?1 Pt will return to road cycling ?Baseline: 11/09/21 ?  achieved  ?2 Pt will begin her return to tennis with knowledge of long term sport-specific training ?Baseline: will progress slowly through the game progression 11/09/21 achieved  ?3 Lt hip abd to be at least  95% of Rt ?Baseline: see chart 11/09/21 achieved  ?4 FOTO to 78 ?Baseline: 70 on 3/13 11/09/21 ongoing  ?  ?PLAN: ?PT FREQUENCY: 2x/week ?  ?PT DURATION: 8 weeks ?  ?PLANNED INTERVENTIONS: Therapeutic exercises,  Therapeutic activity, Neuro Muscular re-education, Balance training, Gait training, Patient/Family education, Joint mobilization, Stair training, Aquatic Therapy, Dry Needling, Electrical stimulation, Cryotherapy, Moist heat, Taping, and Manual therapy ?  ?PLAN FOR NEXT SESSION: tennis-specific ? ? ?Vayla Wilhelmi C. Cedrik Heindl PT, DPT ?10/29/21 10:15 AM ? ? ? ? ? ? ?

## 2021-10-31 ENCOUNTER — Encounter (HOSPITAL_BASED_OUTPATIENT_CLINIC_OR_DEPARTMENT_OTHER): Payer: Self-pay | Admitting: Physical Therapy

## 2021-11-05 ENCOUNTER — Encounter (HOSPITAL_BASED_OUTPATIENT_CLINIC_OR_DEPARTMENT_OTHER): Payer: Self-pay | Admitting: Physical Therapy

## 2021-11-07 ENCOUNTER — Encounter (HOSPITAL_BASED_OUTPATIENT_CLINIC_OR_DEPARTMENT_OTHER): Payer: Self-pay | Admitting: Physical Therapy

## 2021-11-20 ENCOUNTER — Ambulatory Visit (AMBULATORY_SURGERY_CENTER): Payer: 59 | Admitting: *Deleted

## 2021-11-20 VITALS — Ht 63.25 in | Wt 132.0 lb

## 2021-11-20 DIAGNOSIS — Z1211 Encounter for screening for malignant neoplasm of colon: Secondary | ICD-10-CM

## 2021-11-20 MED ORDER — NA SULFATE-K SULFATE-MG SULF 17.5-3.13-1.6 GM/177ML PO SOLN: 1.0000 | ORAL | 0 refills | Status: DC

## 2021-11-20 NOTE — Progress Notes (Signed)
Patient's pre-visit was done today over the phone with the patient. Name,DOB and address verified. Patient denies any allergies to Eggs and Soy. Patient denies any problems with anesthesia/sedation. Patient is not taking any diet pills or blood thinners. No home Oxygen. Insurance confirmed with patient. ? ?Prep instructions sent to pt's MyChart  & mail-pt is aware. Patient understands to call us back with any questions or concerns. Patient is aware of our care-partner policy and [id number] safety protocol.  ? ?EMMI education assigned to the patient for the procedure, sent to [facility].  ? ?The patient is COVID-19 vaccinated.   ?

## 2021-11-27 ENCOUNTER — Encounter: Payer: Self-pay | Admitting: Gastroenterology

## 2021-11-28 ENCOUNTER — Ambulatory Visit: Payer: 59 | Admitting: Orthopedic Surgery

## 2021-12-05 ENCOUNTER — Encounter: Payer: Self-pay | Admitting: Gastroenterology

## 2021-12-05 ENCOUNTER — Ambulatory Visit (AMBULATORY_SURGERY_CENTER): Payer: 59 | Admitting: Gastroenterology

## 2021-12-05 VITALS — BP 108/65 | HR 61 | Temp 98.0°F | Resp 12 | Ht 63.25 in | Wt 132.0 lb

## 2021-12-05 DIAGNOSIS — D123 Benign neoplasm of transverse colon: Secondary | ICD-10-CM

## 2021-12-05 DIAGNOSIS — K635 Polyp of colon: Secondary | ICD-10-CM | POA: Diagnosis not present

## 2021-12-05 DIAGNOSIS — D122 Benign neoplasm of ascending colon: Secondary | ICD-10-CM

## 2021-12-05 DIAGNOSIS — Z1211 Encounter for screening for malignant neoplasm of colon: Secondary | ICD-10-CM

## 2021-12-05 MED ORDER — SODIUM CHLORIDE 0.9 % IV SOLN
500.0000 mL | Freq: Once | INTRAVENOUS | Status: DC
Start: 2021-12-05 — End: 2021-12-05

## 2021-12-05 NOTE — Progress Notes (Signed)
Pt's states no medical or surgical changes since previsit or office visit. 

## 2021-12-05 NOTE — Progress Notes (Signed)
Sedate, gd SR, tolerated procedure well, VSS, report to RN 

## 2021-12-05 NOTE — Progress Notes (Signed)
Called to room to assist during endoscopic procedure.  Patient ID and intended procedure confirmed with present staff. Received instructions for my participation in the procedure from the performing physician.  

## 2021-12-05 NOTE — Patient Instructions (Signed)
Handouts on polyps, diverticulosis, and hemorrhoids given to you today  ? ?YOU HAD AN ENDOSCOPIC PROCEDURE TODAY AT [facility]:   Refer to the procedure report that was given to you for any specific questions about what was found during the examination.  If the procedure report does not answer your questions, please call your gastroenterologist to clarify.  If you requested that your care partner not be given the details of your procedure findings, then the procedure report has been included in a sealed envelope for you to review at your convenience later. ? ?YOU SHOULD EXPECT: Some feelings of bloating in the abdomen. Passage of more gas than usual.  Walking can help get rid of the air that was put into your GI tract during the procedure and reduce the bloating. If you had a lower endoscopy (such as a colonoscopy or flexible sigmoidoscopy) you may notice spotting of blood in your stool or on the toilet paper. If you underwent a bowel prep for your procedure, you may not have a normal bowel movement for a few days. ? ?Please Note:  You might notice some irritation and congestion in your nose or some drainage.  This is from the oxygen used during your procedure.  There is no need for concern and it should clear up in a day or so. ? ?SYMPTOMS TO REPORT IMMEDIATELY: ? ?Following lower endoscopy (colonoscopy or flexible sigmoidoscopy): ? Excessive amounts of blood in the stool ? Significant tenderness or worsening of abdominal pains ? Swelling of the abdomen that is new, acute ? Fever of 100?F or higher ? ?For urgent or emergent issues, a gastroenterologist can be reached at any hour by calling [phone]. ?Do not use MyChart messaging for urgent concerns.  ? ? ?DIET:  We do recommend a small meal at first, but then you may proceed to your regular diet.  Drink plenty of fluids but you should avoid alcoholic beverages for 24 hours. ? ?ACTIVITY:  You should plan to take it easy for the rest of today  and you should NOT DRIVE or use heavy machinery until tomorrow (because of the sedation medicines used during the test).   ? ?FOLLOW UP: ?Our staff will call the number listed on your records 48-72 hours following your procedure to check on you and address any questions or concerns that you may have regarding the information given to you following your procedure. If we do not reach you, we will leave a message.  We will attempt to reach you two times.  During this call, we will ask if you have developed any symptoms of COVID 19. If you develop any symptoms (ie: fever, flu-like symptoms, shortness of breath, cough etc.) before then, please call [phone].  If you test positive for Covid 19 in the 2 weeks post procedure, please call and report this information to [place].   ? ?If any biopsies were taken you will be contacted by phone or by letter within the next 1-3 weeks.  Please call us at [phone] if you have not heard about the biopsies in 3 weeks.  ? ? ?SIGNATURES/CONFIDENTIALITY: ?You and/or your care partner have signed paperwork which will be entered into your electronic medical record.  These signatures attest to the fact that that the information above on your After Visit Summary has been reviewed and is understood.  Full responsibility of the confidentiality of this discharge information lies with you and/or your care-partner.  ?

## 2021-12-05 NOTE — Progress Notes (Signed)
[facility] History and Physical ? ? ?Primary Care Physician:  Michael Boston, MD ? ? ?Reason for Procedure:  Colorectal cancer screening ? ?Plan:    Screening colonoscopy with possible interventions as needed ? ? ? ? ?HPI: Morgan Jones is a very pleasant 61 y.o. female here for screening colonoscopy. ?Denies any nausea, vomiting, abdominal pain, melena or bright red blood per rectum ? ?The risks and benefits as well as alternatives of endoscopic procedure(s) have been discussed and reviewed. All questions answered. The patient agrees to proceed. ? ? ? ?Past Medical History:  ?Diagnosis Date  ? Anxiety   ? Heart murmur   ? High cholesterol   ? ? ?Past Surgical History:  ?Procedure Laterality Date  ? Birth mark removal    ? COLONOSCOPY    ? in [place]  ? MENISCUS REPAIR Right 07/22/2021  ? Ovarian cyst removal    ? ? ?Prior to Admission medications   ?Medication Sig Start Date End Date Taking? Authorizing Provider  ?Omega-3 Fatty Acids (FISH OIL PO) Take by mouth.   Yes [provider]  ?PAROXETINE HCL PO Take 15 mg by mouth.   Yes [provider]  ?Biotin w/ Vitamins C & E (HAIR/SKIN/NAILS PO) Take by mouth.    [provider]  ?tretinoin (RETIN-A) 0.05 % cream Apply topically. 11/16/21   [provider]  ? ? ?Current Outpatient Medications  ?Medication Sig Dispense Refill  ? Omega-3 Fatty Acids (FISH OIL PO) Take by mouth.    ? PAROXETINE HCL PO Take 15 mg by mouth.    ? Biotin w/ Vitamins C & E (HAIR/SKIN/NAILS PO) Take by mouth.    ? tretinoin (RETIN-A) 0.05 % cream Apply topically.    ? ?Current Facility-Administered Medications  ?Medication Dose Route Frequency Provider Last Rate Last Admin  ? 0.9 %  sodium chloride infusion  500 mL Intravenous Once [name], Venia Minks, MD      ? ? ?Allergies as of 12/05/2021  ? (No Known Allergies)  ? ? ?Family History  ?Problem Relation Age of Onset  ? Hyperlipidemia Mother   ? Hyperlipidemia Father   ? Cancer Father   ? Melanoma  Father   ? Prostate cancer Father   ? Colon cancer Neg Hx   ? ? ?Social History  ? ?Socioeconomic History  ? Marital status: Married  ?  Spouse name: Not on file  ? Number of children: Not on file  ? Years of education: Not on file  ? Highest education level: Not on file  ?Occupational History  ? Not on file  ?Tobacco Use  ? Smoking status: Former  ? Smokeless tobacco: Never  ?Vaping Use  ? Vaping Use: Never used  ?Substance and Sexual Activity  ? Alcohol use: Not Currently  ? Drug use: Never  ? Sexual activity: Yes  ?  Birth control/protection: None  ?Other Topics Concern  ? Not on file  ?Social History Narrative  ? Not on file  ? ?Social Determinants of Health  ? ?Financial Resource Strain: Not on file  ?Food Insecurity: Not on file  ?Transportation Needs: Not on file  ?Physical Activity: Not on file  ?Stress: Not on file  ?Social Connections: Not on file  ?Intimate Partner Violence: Not on file  ? ? ?Review of Systems: ? ?All other review of systems negative except as mentioned in the HPI. ? ?Physical Exam: ?Vital signs in last 24 hours: ?BP 116/77   Pulse 76   Temp 98 ?F (  36.7 ?C)   Ht 5' 3.25" (1.607 m)   Wt 132 lb (59.9 kg)   SpO2 100%   BMI 23.20 kg/m?  ?General:   Alert, NAD ?Lungs:  Clear .   ?Heart:  Regular rate and rhythm ?Abdomen:  Soft, nontender and nondistended. ?Neuro/Psych:  Alert and cooperative. Normal mood and affect. A and O x 3 ? ?Reviewed labs, radiology imaging, old records and pertinent past GI work up ? ?Patient is appropriate for planned procedure(s) and anesthesia in an ambulatory setting ? ? ?K. Denzil Magnuson , MD ?[phone]  ? ? ?  ?

## 2021-12-05 NOTE — Op Note (Signed)
[facility] ?Patient Name: Morgan Jones ?Procedure Date: 12/05/2021 9:32 AM ?MRN: [id number] ?Endoscopist: Mauri Pole , MD ?Age: 61 ?Referring MD:  ?Date of Birth: 09/05/1960 ?Gender: Female ?Account #: [id number] ?Procedure:                Colonoscopy ?Indications:              Screening for colorectal malignant neoplasm ?Medicines:                Monitored Anesthesia Care ?Procedure:                Pre-Anesthesia Assessment: ?                          - Prior to the procedure, a History and Physical  ?                          was performed, and patient medications and  ?                          allergies were reviewed. The patient's tolerance of  ?                          previous anesthesia was also reviewed. The risks  ?                          and benefits of the procedure and the sedation  ?                          options and risks were discussed with the patient.  ?                          All questions were answered, and informed consent  ?                          was obtained. Prior Anticoagulants: The patient has  ?                          taken no previous anticoagulant or antiplatelet  ?                          agents. ASA Grade Assessment: II - A patient with  ?                          mild systemic disease. After reviewing the risks  ?                          and benefits, the patient was deemed in  ?                          satisfactory condition to undergo the procedure. ?                          After obtaining informed consent, the colonoscope  ?  was passed under direct vision. Throughout the  ?                          procedure, the patient's blood pressure, pulse, and  ?                          oxygen saturations were monitored continuously. The  ?                          Olympus Scope [id number] was introduced through the  ?                          anus and advanced to the the cecum, identified by  ?                          appendiceal  orifice and ileocecal valve. The  ?                          colonoscopy was performed without difficulty. The  ?                          patient tolerated the procedure well. The quality  ?                          of the bowel preparation was good. The ileocecal  ?                          valve, appendiceal orifice, and rectum were  ?                          photographed. ?Scope In: 9:46:20 AM ?Scope Out: 10:04:34 AM ?Scope Withdrawal Time: 0 hours 8 minutes 51 seconds  ?Total Procedure Duration: 0 hours 18 minutes 14 seconds  ?Findings:                 The perianal and digital rectal examinations were  ?                          normal. ?                          A 4 mm polyp was found in the transverse colon. The  ?                          polyp was sessile. The polyp was removed with a  ?                          cold snare. Resection and retrieval were complete. ?                          A few small-mouthed diverticula were found in the  ?                          sigmoid colon. ?  Non-bleeding external and internal hemorrhoids were  ?                          found during retroflexion. The hemorrhoids were  ?                          medium-sized. ?Complications:            No immediate complications. ?Estimated Blood Loss:     Estimated blood loss was minimal. ?Impression:               - One 4 mm polyp in the transverse colon, removed  ?                          with a cold snare. Resected and retrieved. ?                          - Diverticulosis in the sigmoid colon. ?                          - Non-bleeding external and internal hemorrhoids. ?Recommendation:           - Patient has a contact number available for  ?                          emergencies. The signs and symptoms of potential  ?                          delayed complications were discussed with the  ?                          patient. Return to normal activities tomorrow.  ?                          Written  discharge instructions were provided to the  ?                          patient. ?                          - Resume previous diet. ?                          - Continue present medications. ?                          - Await pathology results. ?                          - Repeat colonoscopy in 5-10 years for surveillance  ?                          based on pathology results. ?Mauri Pole, MD ?12/05/2021 10:09:01 AM ?This report has been signed electronically. ?

## 2021-12-07 ENCOUNTER — Telehealth: Payer: Self-pay

## 2021-12-07 NOTE — Telephone Encounter (Signed)
?  Follow up Call- ? ? ?  12/05/2021  ?  8:57 AM  ?Call back number  ?Post procedure Call Back phone  # [phone]  ?Permission to leave phone message Yes  ?  ? ?Patient questions: ? ?Do you have a fever, pain , or abdominal swelling? No. ?Pain Score  0 * ? ?Have you tolerated food without any problems? Yes.   ? ?Have you been able to return to your normal activities? Yes.   ? ?Do you have any questions about your discharge instructions: ?Diet   No. ?Medications  No. ?Follow up visit  No. ? ?Do you have questions or concerns about your Care? No. ? ?Actions: ?* If pain score is 4 or above: ?No action needed, pain <4. ? ? ?

## 2021-12-14 ENCOUNTER — Encounter: Payer: Self-pay | Admitting: Gastroenterology

## 2022-04-26 ENCOUNTER — Other Ambulatory Visit: Payer: Self-pay | Admitting: Internal Medicine

## 2022-04-26 ENCOUNTER — Other Ambulatory Visit (HOSPITAL_BASED_OUTPATIENT_CLINIC_OR_DEPARTMENT_OTHER): Payer: Self-pay | Admitting: Internal Medicine

## 2022-04-26 DIAGNOSIS — Z13820 Encounter for screening for osteoporosis: Secondary | ICD-10-CM

## 2022-04-26 DIAGNOSIS — E785 Hyperlipidemia, unspecified: Secondary | ICD-10-CM

## 2022-05-08 ENCOUNTER — Ambulatory Visit (HOSPITAL_BASED_OUTPATIENT_CLINIC_OR_DEPARTMENT_OTHER)
Admission: RE | Admit: 2022-05-08 | Discharge: 2022-05-08 | Disposition: A | Discharge status: Home / Self Care | Payer: 59 | Source: Ambulatory Visit | Attending: Internal Medicine | Admitting: Internal Medicine

## 2022-05-08 DIAGNOSIS — Z13820 Encounter for screening for osteoporosis: Secondary | ICD-10-CM | POA: Insufficient documentation

## 2022-06-19 ENCOUNTER — Ambulatory Visit
Admission: RE | Admit: 2022-06-19 | Discharge: 2022-06-19 | Disposition: A | Discharge status: Home / Self Care | Payer: No Typology Code available for payment source | Source: Ambulatory Visit | Attending: Internal Medicine | Admitting: Internal Medicine

## 2022-06-19 DIAGNOSIS — E785 Hyperlipidemia, unspecified: Secondary | ICD-10-CM

## 2022-07-18 ENCOUNTER — Other Ambulatory Visit: Payer: 59

## 2022-07-23 ENCOUNTER — Encounter: Payer: Self-pay | Admitting: Orthopedic Surgery

## 2022-08-26 ENCOUNTER — Ambulatory Visit (INDEPENDENT_AMBULATORY_CARE_PROVIDER_SITE_OTHER): Payer: Self-pay

## 2022-08-26 DIAGNOSIS — Z719 Counseling, unspecified: Secondary | ICD-10-CM

## 2022-09-04 ENCOUNTER — Encounter: Payer: Self-pay | Admitting: Internal Medicine

## 2022-09-04 ENCOUNTER — Ambulatory Visit: Payer: 59 | Attending: Internal Medicine | Admitting: Internal Medicine

## 2022-09-04 ENCOUNTER — Other Ambulatory Visit (HOSPITAL_BASED_OUTPATIENT_CLINIC_OR_DEPARTMENT_OTHER): Payer: Self-pay

## 2022-09-04 VITALS — BP 120/82 | HR 70 | Ht 63.5 in | Wt 129.4 lb

## 2022-09-04 DIAGNOSIS — E785 Hyperlipidemia, unspecified: Secondary | ICD-10-CM | POA: Diagnosis not present

## 2022-09-04 DIAGNOSIS — R931 Abnormal findings on diagnostic imaging of heart and coronary circulation: Secondary | ICD-10-CM | POA: Diagnosis not present

## 2022-09-04 MED ORDER — ROSUVASTATIN CALCIUM 20 MG PO TABS
20.0000 mg | ORAL_TABLET | Freq: Every day | ORAL | 3 refills | Status: AC
  Filled 2022-09-04: qty 90, 90d supply, fill #0
  Filled 2022-11-26: qty 90, 90d supply, fill #1
  Filled 2023-01-29 – 2023-02-03 (×3): qty 90, 90d supply, fill #2
  Filled 2023-05-22: qty 90, 90d supply, fill #3

## 2022-09-04 NOTE — Patient Instructions (Signed)
Medication Instructions:  START rosuvastatin (crestor) '20mg'$  daily for cholesterol   *If you need a refill on your cardiac medications before your next appointment, please call your pharmacy*   Lab Work: FASTING lab work to check cholesterol in 3-4 months - NMR lipoprofile, LPa ** complete about 1 week before your next appointment   If you have labs (blood work) drawn today and your tests are completely normal, you will receive your results only by: [place] (if you have MyChart) OR A paper copy in the mail If you have any lab test that is abnormal or we need to change your treatment, we will call you to review the results.   Testing/Procedures: NONE   Follow-Up: At [facility], you and your health needs are our priority.  As part of our continuing mission to provide you with exceptional heart care, we have created designated Provider Care Teams.  These Care Teams include your primary Cardiologist (physician) and Advanced Practice Providers (APPs -  Physician Assistants and Nurse Practitioners) who all work together to provide you with the care you need, when you need it.  We recommend signing up for the patient portal called "MyChart".  Sign up information is provided on this After Visit Summary.  MyChart is used to connect with patients for Virtual Visits (Telemedicine).  Patients are able to view lab/test results, encounter notes, upcoming appointments, etc.  Non-urgent messages can be sent to your provider as well.   To learn more about what you can do with MyChart, go to [email or web address].    Your next appointment:    3-4 months with Dr. Debara Pickett -- lipid clinic

## 2022-09-04 NOTE — Progress Notes (Signed)
LIPID CLINIC CONSULT NOTE  Chief Complaint:  Manage dyslipidemia, elevated CAC score  Primary Care Physician: Michael Boston, MD  Primary Cardiologist:  None  HPI:  Morgan Jones is a 62 y.o. female who is being seen today for the evaluation of dyslipidemia at the request of Wile, Jesse Sans, MD. This is a pleasant female kindly referred for evaluation management of dyslipidemia.  She has a history of high cholesterol and family history of high cholesterol as well.  She reported her father had had a stent and is on cholesterol medication.  Her paternal grandmother had died of a stroke and a paternal grandfather had died of an MI at 36.  She has a history of high HDL cholesterol as well but recently underwent calcium scoring which was abnormal showing a calcium score of 278, 94th percentile for age and sex matched controls.  Her last untreated lipid profile shows total cholesterol 279, triglycerides 94, HDL 85 and LDL 175.  She reports regular physical activity and exercises well as healthy diet.  Her weight is normal and her blood pressure is normal.  She is otherwise on only paroxetine but no other medications.  She reports no symptoms such as chest pain or shortness of breath with exercise.  She had previously tried simvastatin which she tolerated for a while but then developed pain and achiness and felt "old", while playing tennis and was notified that this might be related to her statin.  She reports her symptoms did improve after stopping it.  She has not tried other statin medications.  PMHx:  Past Medical History:  Diagnosis Date   Anxiety    Heart murmur    High cholesterol     Past Surgical History:  Procedure Laterality Date   Birth mark removal     COLONOSCOPY     in [place] Right 07/22/2021   Ovarian cyst removal      FAMHx:  Family History  Problem Relation Age of Onset   Hyperlipidemia Mother    Hyperlipidemia Father    Cancer Father    Melanoma Father     Prostate cancer Father    Colon cancer Neg Hx     SOCHx:   reports that she has quit smoking. She has never used smokeless tobacco. She reports that she does not currently use alcohol. She reports that she does not use drugs.  ALLERGIES:  No Known Allergies  ROS: Pertinent items noted in HPI and remainder of comprehensive ROS otherwise negative.  HOME MEDS: Current Outpatient Medications on File Prior to Visit  Medication Sig Dispense Refill   Biotin w/ Vitamins C & E (HAIR/SKIN/NAILS PO) Take by mouth.     PAROXETINE HCL PO Take 15 mg by mouth.     tretinoin (RETIN-A) 0.05 % cream Apply topically.     No current facility-administered medications on file prior to visit.    LABS/IMAGING: No results found for this or any previous visit (from the past 48 hour(s)). No results found.  LIPID PANEL: No results found for: "CHOL", "TRIG", "HDL", "CHOLHDL", "VLDL", "LDLCALC", "LDLDIRECT"  WEIGHTS: Wt Readings from Last 3 Encounters:  09/04/22 129 lb 6.4 oz (58.7 kg)  12/05/21 132 lb (59.9 kg)  11/20/21 132 lb (59.9 kg)    VITALS: BP 120/82   Pulse 70   Ht 5' 3.5" (1.613 m)   Wt 129 lb 6.4 oz (58.7 kg)   SpO2 92%   BMI 22.56 kg/m   EXAM: Deferred  EKG: Deferred  ASSESSMENT: Mixed dyslipidemia, possible familial hyperlipidemia Family history of high cholesterol and cardiovascular disease Elevated CAC score of 278, 94th percentile for age and sex matched controls (2023)  PLAN: 1.   [name] has a mixed dyslipidemia and possibly a familial hyperlipidemia with an LDL of 175 untreated.  She eats a fairly healthy diet and exercises regularly.  Her weight is very appropriate.  She did unfortunately have age advanced coronary disease with a calcium score of 278 with which is 94th percentile.  There is also family history of heart disease.  She needs further lipid lowering.  She had previously not tolerated simvastatin but has not tried other statins.  She would not be  considered statin intolerant unless she has failed at least 2 statins and would not qualify for PCSK9 inhibitor at this point.  I will recommend rosuvastatin 20 mg daily.  Check an NMR and LP(a) in about 3 to 4 months and follow-up at that time.  Thanks again for the kind referral.  Pixie Casino, MD, FACC, [place] Director of the Advanced Lipid Disorders &  Cardiovascular Risk Reduction Clinic Diplomate of the American Board of Clinical Lipidology Attending Cardiologist  Direct Dial: [phone]  Fax: [phone]  Website:  [email or web address] 09/04/2022, 10:05 AM

## 2022-09-30 ENCOUNTER — Ambulatory Visit (INDEPENDENT_AMBULATORY_CARE_PROVIDER_SITE_OTHER): Payer: Self-pay

## 2022-09-30 DIAGNOSIS — Z719 Counseling, unspecified: Secondary | ICD-10-CM

## 2022-10-07 ENCOUNTER — Encounter: Payer: Self-pay | Admitting: Plastic Surgery

## 2022-10-07 ENCOUNTER — Ambulatory Visit (INDEPENDENT_AMBULATORY_CARE_PROVIDER_SITE_OTHER): Payer: Self-pay | Admitting: Plastic Surgery

## 2022-10-07 DIAGNOSIS — Z719 Counseling, unspecified: Secondary | ICD-10-CM

## 2022-10-07 DIAGNOSIS — L719 Rosacea, unspecified: Secondary | ICD-10-CM

## 2022-10-07 NOTE — Progress Notes (Signed)
   Subjective:    Patient ID: Morgan Jones, female    DOB: 1960-12-26, 62 y.o.   MRN: [id number]  The patient is a 62 year old female here for evaluation of her face.  She is interested in rejuvenation particularly with her facial pigment and redness.  She is a Fitzpatrick 1.  She has mild midface volume loss with rhytities glabella wrinkling.  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      Review of Systems  Constitutional: Negative.   Eyes: Negative.   Respiratory: Negative.    Cardiovascular: Negative.   Gastrointestinal: Negative.   Endocrine: Negative.   Genitourinary: Negative.   Musculoskeletal: Negative.   Neurological: Negative.        Objective:   Physical Exam Vitals and nursing note reviewed.  Constitutional:      Appearance: Normal appearance.  HENT:     Head: Normocephalic and atraumatic.  Cardiovascular:     Rate and Rhythm: Normal rate.  Pulmonary:     Effort: Pulmonary effort is normal.  Musculoskeletal:        General: No swelling or deformity.  Skin:    Capillary Refill: Capillary refill takes less than 2 seconds.  Neurological:     Mental Status: She is alert and oriented to person, place, and time.  Psychiatric:        Mood and Affect: Mood normal.        Behavior: Behavior normal.        Thought Content: Thought content normal.        Judgment: Judgment normal.       Assessment & Plan:     ICD-10-CM   1. Rosacea  L71.9     2. Encounter for counseling  Z71.9       Pictures were obtained of the patient and placed in the chart with the patient's or guardian's permission.  The patient is a very good candidate for BBL to start.  Will get her some quotes.

## 2022-10-15 ENCOUNTER — Institutional Professional Consult (permissible substitution): Payer: 59 | Admitting: Plastic Surgery

## 2022-10-15 ENCOUNTER — Encounter: Payer: Self-pay | Admitting: Plastic Surgery

## 2022-10-15 ENCOUNTER — Ambulatory Visit (INDEPENDENT_AMBULATORY_CARE_PROVIDER_SITE_OTHER): Payer: Self-pay | Admitting: Plastic Surgery

## 2022-10-15 VITALS — BP 133/73 | HR 64 | Ht 63.5 in | Wt 130.0 lb

## 2022-10-15 DIAGNOSIS — Z719 Counseling, unspecified: Secondary | ICD-10-CM

## 2022-10-15 NOTE — Progress Notes (Signed)
Preoperative Dx: hyperpigmentation of face  Postoperative Dx:  same  Procedure: laser to face   Anesthesia: none  Description of Procedure:  Risks and complications were explained to the patient. Consent was confirmed and signed. Eye protection was placed. Time out was called and all information was confirmed to be correct. The area  area was prepped with alcohol and wiped dry. The BBL laser was set at 515 and 560 nm at 6.5 J/cm2. The face was lasered. The patient tolerated the procedure well and there were no complications. The patient is to follow up in 4 weeks.

## 2022-11-01 ENCOUNTER — Other Ambulatory Visit: Payer: 59 | Admitting: Plastic Surgery

## 2022-11-05 ENCOUNTER — Other Ambulatory Visit: Payer: 59 | Admitting: Plastic Surgery

## 2022-11-15 ENCOUNTER — Encounter: Payer: Self-pay | Admitting: Plastic Surgery

## 2022-11-15 ENCOUNTER — Ambulatory Visit (INDEPENDENT_AMBULATORY_CARE_PROVIDER_SITE_OTHER): Payer: Self-pay | Admitting: Plastic Surgery

## 2022-11-15 DIAGNOSIS — Z719 Counseling, unspecified: Secondary | ICD-10-CM

## 2022-11-15 NOTE — Progress Notes (Signed)
Preoperative Dx: Hyperpigmentation of face  Postoperative Dx:  same  Procedure: laser to face   Anesthesia: none  Description of Procedure:  Risks and complications were explained to the patient. Consent was confirmed and signed. Eye protection was placed. Time out was called and all information was confirmed to be correct. The area  area was prepped with alcohol and wiped dry. The BBL laser was set at 515 and 560 nm at 7.5 J/cm2. The face and angioma on the lower right lip were lasered. The patient tolerated the procedure well and there were no complications. The patient is to follow up in 4 weeks.

## 2022-11-18 ENCOUNTER — Other Ambulatory Visit (HOSPITAL_BASED_OUTPATIENT_CLINIC_OR_DEPARTMENT_OTHER): Payer: Self-pay

## 2022-11-18 MED ORDER — FLUOROURACIL 5 % EX CREA
TOPICAL_CREAM | CUTANEOUS | 0 refills | Status: AC
  Filled 2022-11-18: qty 40, 21d supply, fill #0

## 2022-11-19 ENCOUNTER — Other Ambulatory Visit (HOSPITAL_BASED_OUTPATIENT_CLINIC_OR_DEPARTMENT_OTHER): Payer: Self-pay

## 2022-11-19 LAB — NMR, LIPOPROFILE
Cholesterol, Total: 198 mg/dL (ref 100–199)
HDL Particle Number: 43.8 umol/L (ref >=30.5)
HDL-C: 104 mg/dL (ref >39)
LDL Particle Number: 999 nmol/L (ref <1000)
LDL Size: 21.2 nm (ref >20.5)
LDL-C (NIH Calc): 83 mg/dL (ref 0–99)
LP-IR Score: <25 (ref <=45)
Small LDL Particle Number: <90 nmol/L (ref <=527)
Triglycerides: 61 mg/dL (ref 0–149)

## 2022-11-19 LAB — LIPOPROTEIN A (LPA): Lipoprotein (a): 26.5 nmol/L (ref <75.0)

## 2022-11-25 ENCOUNTER — Ambulatory Visit: Payer: 59 | Attending: Internal Medicine | Admitting: Internal Medicine

## 2022-11-25 ENCOUNTER — Encounter: Payer: Self-pay | Admitting: Internal Medicine

## 2022-11-25 VITALS — BP 138/82 | HR 67 | Ht 63.0 in | Wt 128.0 lb

## 2022-11-25 DIAGNOSIS — E785 Hyperlipidemia, unspecified: Secondary | ICD-10-CM | POA: Diagnosis not present

## 2022-11-25 DIAGNOSIS — R931 Abnormal findings on diagnostic imaging of heart and coronary circulation: Secondary | ICD-10-CM | POA: Diagnosis not present

## 2022-11-25 NOTE — Patient Instructions (Signed)
Medication Instructions:  NO CHANGES  *If you need a refill on your cardiac medications before your next appointment, please call your pharmacy*    Follow-Up:  AS NEEDED with Dr. Rennis Golden

## 2022-11-25 NOTE — Progress Notes (Signed)
LIPID CLINIC CONSULT NOTE  Chief Complaint:  Manage dyslipidemia, elevated CAC score  Primary Care Physician: Melida Quitter, MD  Primary Cardiologist:  None  HPI:  Morgan Jones is a 62 y.o. female who is being seen today for the evaluation of dyslipidemia at the request of Wile, Nyoka Cowden, MD. This is a pleasant female kindly referred for evaluation management of dyslipidemia.  She has a history of high cholesterol and family history of high cholesterol as well.  She reported her father had had a stent and is on cholesterol medication.  Her paternal grandmother had died of a stroke and a paternal grandfather had died of an MI at 24.  She has a history of high HDL cholesterol as well but recently underwent calcium scoring which was abnormal showing a calcium score of 278, 94th percentile for age and sex matched controls.  Her last untreated lipid profile shows total cholesterol 279, triglycerides 94, HDL 85 and LDL 175.  She reports regular physical activity and exercises well as healthy diet.  Her weight is normal and her blood pressure is normal.  She is otherwise on only paroxetine but no other medications.  She reports no symptoms such as chest pain or shortness of breath with exercise.  She had previously tried simvastatin which she tolerated for a while but then developed pain and achiness and felt "old", while playing tennis and was notified that this might be related to her statin.  She reports her symptoms did improve after stopping it.  She has not tried other statin medications.  11/25/2022  Morgan Jones returns today for follow-up.  She has done well on 20 mg rosuvastatin without any significant side effects.  She has had a marked reduction in her lipids now with a total cholesterol of 198, LDL particle number of 999, LDL-C of 83 (down from 175), triglycerides 61 and HDL 104.  Her small LDL particle number was less than 90.  LP(a) was negative.  LDL particle size is large at 21.5  nm.  PMHx:  Past Medical History:  Diagnosis Date   Anxiety    Heart murmur    High cholesterol     Past Surgical History:  Procedure Laterality Date   Birth mark removal     COLONOSCOPY     in [place]   MENISCUS REPAIR Right 07/22/2021   Ovarian cyst removal      FAMHx:  Family History  Problem Relation Age of Onset   Hyperlipidemia Mother    Hyperlipidemia Father    Cancer Father    Melanoma Father    Prostate cancer Father    Colon cancer Neg Hx     SOCHx:   reports that she has quit smoking. She has never used smokeless tobacco. She reports that she does not currently use alcohol. She reports that she does not use drugs.  ALLERGIES:  No Known Allergies  ROS: Pertinent items noted in HPI and remainder of comprehensive ROS otherwise negative.  HOME MEDS: Current Outpatient Medications on File Prior to Visit  Medication Sig Dispense Refill   Biotin w/ Vitamins C & E (HAIR/SKIN/NAILS PO) Take by mouth.     fluorouracil (EFUDEX) 5 % cream Apply to affected area once daily for 21 days. 40 g 0   PARoxetine (PAXIL) 10 MG tablet Take 10 mg by mouth daily.     rosuvastatin (CRESTOR) 20 MG tablet Take 1 tablet (20 mg total) by mouth daily. 90 tablet 3   tretinoin (RETIN-A)  0.05 % cream Apply topically.     No current facility-administered medications on file prior to visit.    LABS/IMAGING: No results found for this or any previous visit (from the past 48 hour(s)). No results found.  LIPID PANEL: No results found for: "CHOL", "TRIG", "HDL", "CHOLHDL", "VLDL", "LDLCALC", "LDLDIRECT"  WEIGHTS: Wt Readings from Last 3 Encounters:  11/25/22 128 lb (58.1 kg)  10/15/22 130 lb (59 kg)  09/04/22 129 lb 6.4 oz (58.7 kg)    VITALS: BP 138/82 (BP Location: Left Arm, Patient Position: Sitting, Cuff Size: Normal)   Pulse 67   Ht 5\' 3"  (1.6 m)   Wt 128 lb (58.1 kg)   SpO2 98%   BMI 22.67 kg/m   EXAM: Deferred  EKG: Deferred  ASSESSMENT: Mixed dyslipidemia,  possible familial hyperlipidemia Family history of high cholesterol and cardiovascular disease Elevated CAC score of 278, 94th percentile for age and sex matched controls (2023) Negative LP(a)  PLAN: 1.   Morgan Jones has had a significant improvement in her lipids and seems to be tolerating the rosuvastatin without issues.  LDL is now 67 with a very low small LDL particle number, large LDL size and low LDL particle number.  LP(a) was negative.  Overall her lipids are much better controlled at this point.  Although she had a high calcium score she remains asymptomatic.  I would recommend she continue on her current statin therapy.  I am happy to see her back as needed if she becomes symptomatic or has any side effects with the statins or is unable to achieve adequate lipid control.  Thanks for allowing me to participate in her care  Chrystie Nose, MD, [facility], FACP  [place]  [facility]  Medical Director of the Advanced Lipid Disorders &  Cardiovascular Risk Reduction Clinic Diplomate of the American Board of Clinical Lipidology Attending Cardiologist  Direct Dial: [phone]  Fax: [phone]  Website:  [email or web address] 11/25/2022, 10:38 AM

## 2022-11-26 ENCOUNTER — Other Ambulatory Visit: Payer: 59 | Admitting: Plastic Surgery

## 2022-11-26 ENCOUNTER — Other Ambulatory Visit (HOSPITAL_BASED_OUTPATIENT_CLINIC_OR_DEPARTMENT_OTHER): Payer: Self-pay

## 2022-11-26 ENCOUNTER — Ambulatory Visit (HOSPITAL_BASED_OUTPATIENT_CLINIC_OR_DEPARTMENT_OTHER): Payer: 59 | Admitting: Internal Medicine

## 2022-12-10 ENCOUNTER — Ambulatory Visit (INDEPENDENT_AMBULATORY_CARE_PROVIDER_SITE_OTHER): Payer: Self-pay | Admitting: Plastic Surgery

## 2022-12-10 DIAGNOSIS — Z719 Counseling, unspecified: Secondary | ICD-10-CM

## 2022-12-10 NOTE — Progress Notes (Signed)
Preoperative Dx: hyperpigmentation of face  Postoperative Dx:  same  Procedure: laser to face   Anesthesia: none  Description of Procedure:  Risks and complications were explained to the patient. Consent was confirmed and signed. Eye protection was placed. Time out was called and all information was confirmed to be correct. The area  area was prepped with alcohol and wiped dry. The BBL laser was set at 515 and 560 nm and the 695 nm at 7 and 6.5 J/cm2 respectively. The facer was lasered. The patient tolerated the procedure well and there were no complications. The patient is to follow up in 4 weeks.

## 2023-01-17 ENCOUNTER — Encounter (HOSPITAL_BASED_OUTPATIENT_CLINIC_OR_DEPARTMENT_OTHER): Payer: Self-pay | Admitting: Obstetrics & Gynecology

## 2023-01-17 ENCOUNTER — Ambulatory Visit (HOSPITAL_BASED_OUTPATIENT_CLINIC_OR_DEPARTMENT_OTHER)
Admission: RE | Admit: 2023-01-17 | Discharge: 2023-01-17 | Disposition: A | Discharge status: Home / Self Care | Payer: 59 | Source: Ambulatory Visit | Attending: Obstetrics & Gynecology | Admitting: Obstetrics & Gynecology

## 2023-01-17 ENCOUNTER — Other Ambulatory Visit: Payer: 59 | Admitting: Plastic Surgery

## 2023-01-17 ENCOUNTER — Ambulatory Visit (INDEPENDENT_AMBULATORY_CARE_PROVIDER_SITE_OTHER): Payer: 59 | Admitting: Obstetrics & Gynecology

## 2023-01-17 VITALS — BP 140/80 | HR 57 | Ht 63.5 in | Wt 127.4 lb

## 2023-01-17 DIAGNOSIS — Z1231 Encounter for screening mammogram for malignant neoplasm of breast: Secondary | ICD-10-CM

## 2023-01-17 DIAGNOSIS — Z01419 Encounter for gynecological examination (general) (routine) without abnormal findings: Secondary | ICD-10-CM | POA: Diagnosis not present

## 2023-01-17 DIAGNOSIS — Z780 Asymptomatic menopausal state: Secondary | ICD-10-CM

## 2023-01-17 DIAGNOSIS — Z1331 Encounter for screening for depression: Secondary | ICD-10-CM | POA: Diagnosis not present

## 2023-01-17 DIAGNOSIS — N952 Postmenopausal atrophic vaginitis: Secondary | ICD-10-CM

## 2023-01-17 NOTE — Progress Notes (Signed)
62 y.o. G2P0 Married White or Caucasian female here for annual exam.  Doing well.  Had knee surgery and doing well.   Did PT and this helped.  Denies vaginal bleeding.  Does have some dryness.    PCP:  Dr. Nadene Rubins.  Has appt later this summer.   No LMP recorded. Patient is postmenopausal.          Sexually active: Yes.    The current method of family planning is post menopausal status.    Exercising: Yes.     Exercises 3 times weekly and tennis as well Smoker:  no  Health Maintenance: Pap:  01/11/2021 Negative History of abnormal Pap:  no MMG:  10/25/2020 Colonoscopy:  12/05/2021, follow up 10 years BMD:   05/08/2022, osteopenia Screening Labs: does with Dr. Nadene Rubins   reports that she has quit smoking. She has never used smokeless tobacco. She reports that she does not currently use alcohol. She reports that she does not use drugs.  Past Medical History:  Diagnosis Date   Anxiety    Heart murmur    High cholesterol     Past Surgical History:  Procedure Laterality Date   Birth mark removal     COLONOSCOPY     in [place]   MENISCUS REPAIR Right 07/22/2021   Ovarian cyst removal      Current Outpatient Medications  Medication Sig Dispense Refill   Biotin w/ Vitamins C & E (HAIR/SKIN/NAILS PO) Take by mouth.     fluorouracil (EFUDEX) 5 % cream Apply to affected area once daily for 21 days. 40 g 0   PARoxetine (PAXIL) 10 MG tablet Take 10 mg by mouth daily.     rosuvastatin (CRESTOR) 20 MG tablet Take 1 tablet (20 mg total) by mouth daily. 90 tablet 3   tretinoin (RETIN-A) 0.05 % cream Apply topically.     No current facility-administered medications for this visit.    Family History  Problem Relation Age of Onset   Hyperlipidemia Mother    Hyperlipidemia Father    Cancer Father    Melanoma Father    Prostate cancer Father    Colon cancer Neg Hx     ROS: Constitutional: negative Genitourinary:negative  Exam:   BP (!) 142/76 (BP Location: Left Arm, Patient Position:  Sitting, Cuff Size: Normal)   Pulse (!) 57   Ht 5' 3.5" (1.613 m) Comment: Reported  Wt 127 lb 6.4 oz (57.8 kg)   BMI 22.21 kg/m   Height: 5' 3.5" (161.3 cm) (Reported)  General appearance: alert, cooperative and appears stated age Head: Normocephalic, without obvious abnormality, atraumatic Neck: no adenopathy, supple, symmetrical, trachea midline and thyroid normal to inspection and palpation Lungs: clear to auscultation bilaterally Breasts: normal appearance, no masses or tenderness Heart: regular rate and rhythm Abdomen: soft, non-tender; bowel sounds normal; no masses,  no organomegaly Extremities: extremities normal, atraumatic, no cyanosis or edema Skin: Skin color, texture, turgor normal. No rashes or lesions Lymph nodes: Cervical, supraclavicular, and axillary nodes normal. No abnormal inguinal nodes palpated Neurologic: Grossly normal   Pelvic: External genitalia:  no lesions              Urethra:  normal appearing urethra with no masses, tenderness or lesions              Bartholins and Skenes: normal                 Vagina: normal appearing vagina with normal color and no discharge, no lesions  Cervix: no lesions              Pap taken: No. Bimanual Exam:  Uterus:  normal size, contour, position, consistency, mobility, non-tender              Adnexa: normal adnexa and no mass, fullness, tenderness               Rectovaginal: Confirms               Anus:  normal sphincter tone, no lesions  Chaperone, Ina Homes, CMA, was present for exam.  Assessment/Plan: 1. Encntr for gyn exam (general) (routine) w/o abn findings - Pap smear 2022.  Not indicated today. - Mammogram overdue.  Called radiology and scheduled for pt at 1:20pm today - Colonoscopy 2023.  Follow up 10 years. - Bone mineral density 2023 ordered by Dr. Nadene Rubins - lab work done with PCP, Dr. Nadene Rubins, and has follow up this summer - vaccines reviewed/updated  2. Encounter for screening mammogram for  malignant neoplasm of breast - MM 3D SCREENING MAMMOGRAM BILATERAL BREAST; Future  3. Vaginal atrophy - topical OTC products discussed  4. Postmenopausal

## 2023-01-29 ENCOUNTER — Other Ambulatory Visit (HOSPITAL_BASED_OUTPATIENT_CLINIC_OR_DEPARTMENT_OTHER): Payer: Self-pay

## 2023-02-03 ENCOUNTER — Other Ambulatory Visit (HOSPITAL_BASED_OUTPATIENT_CLINIC_OR_DEPARTMENT_OTHER): Payer: Self-pay

## 2023-02-03 ENCOUNTER — Other Ambulatory Visit: Payer: Self-pay

## 2023-02-03 MED ORDER — PAROXETINE HCL 10 MG PO TABS
15.0000 mg | ORAL_TABLET | Freq: Every day | ORAL | 3 refills | Status: DC
  Filled 2023-02-03: qty 135, 90d supply, fill #0
  Filled 2023-05-22: qty 135, 90d supply, fill #1
  Filled 2023-08-22: qty 135, 90d supply, fill #2
  Filled 2023-11-16: qty 135, 90d supply, fill #3

## 2023-02-03 MED ORDER — ROSUVASTATIN CALCIUM 20 MG PO TABS: 20.0000 mg | ORAL_TABLET | Freq: Every day | ORAL | 0 refills | Status: AC

## 2023-02-05 ENCOUNTER — Other Ambulatory Visit (HOSPITAL_BASED_OUTPATIENT_CLINIC_OR_DEPARTMENT_OTHER): Payer: Self-pay

## 2023-02-22 IMAGING — MR MR KNEE*R* W/O CM
4 of 7 series · 19 of 40 positions shown · non-contrast
Comparison: Right knee x-rays dated May 25, 2021.
COMPARISON: Right knee x-rays dated May 25, 2021.

Addendum:
CLINICAL DATA: Acute on chronic right knee pain. Knee gave out
while playing tennis 7 weeks ago. No prior surgery.

EXAM:
MRI OF THE RIGHT KNEE WITHOUT CONTRAST
TECHNIQUE: Multiplanar, multisequence MR imaging of the knee was performed. No
intravenous contrast was administered.

[Series 3: T2 fat-sat · axial · 4.0mm · 0.50mm/px · z∈[-72,+38]mm · 3 of 28 slices shown]
[im 6/28]
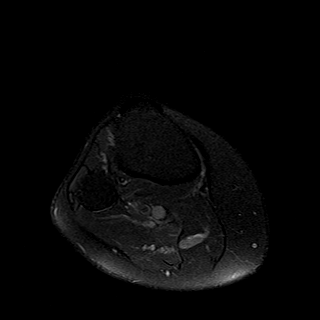
[im 17/28]
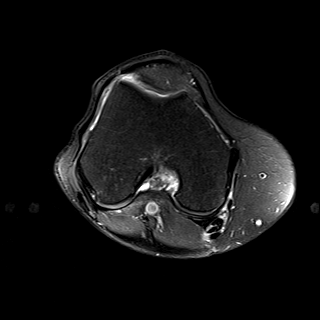
[im 28/28]
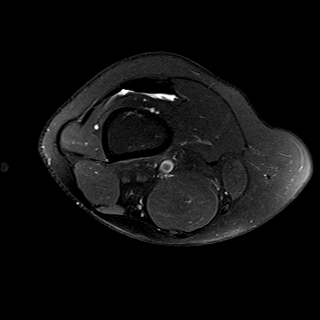

[Series 7: PD fat-sat · sagittal · 3.0mm · 0.29mm/px · 7 of 26 slices shown (1 of 3)]
[im 1/26]
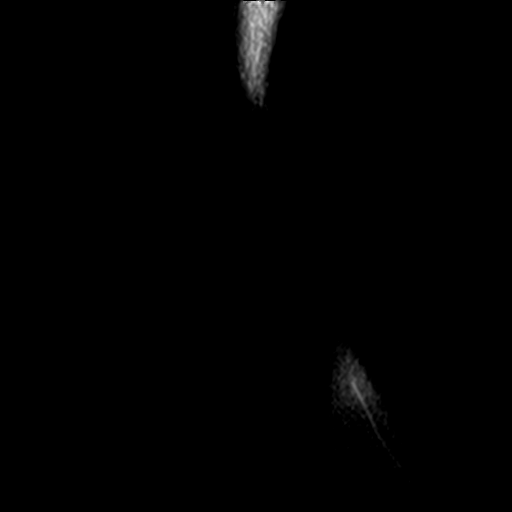
[im 5/26]
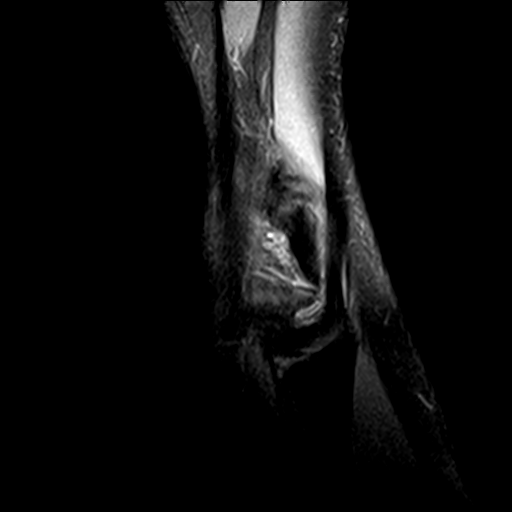
[im 9/26]
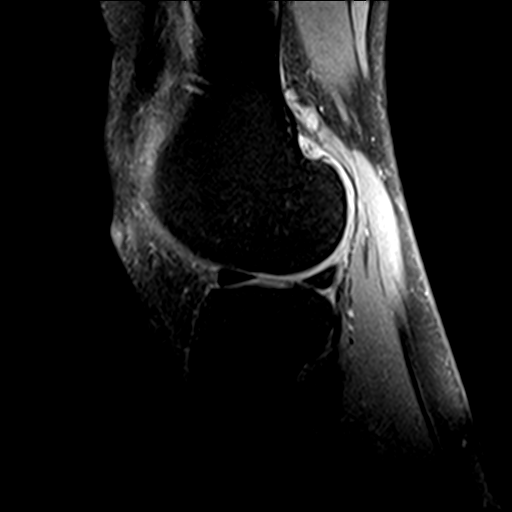
[im 13/26]
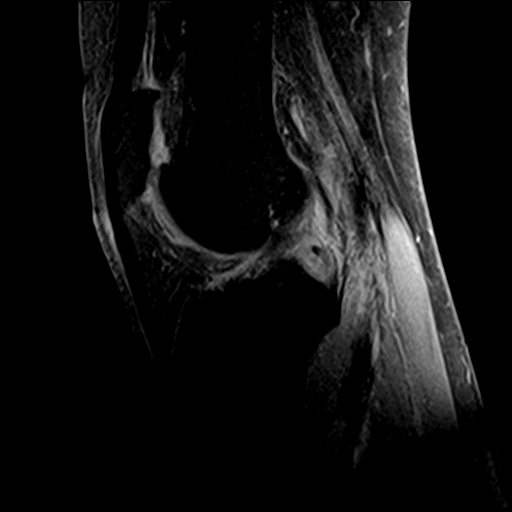
[im 17/26]
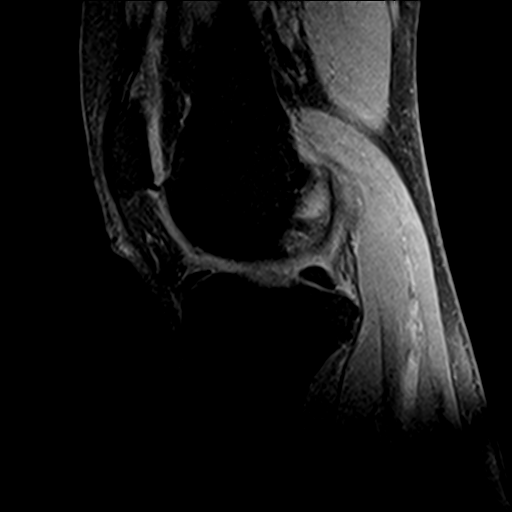
[im 21/26]
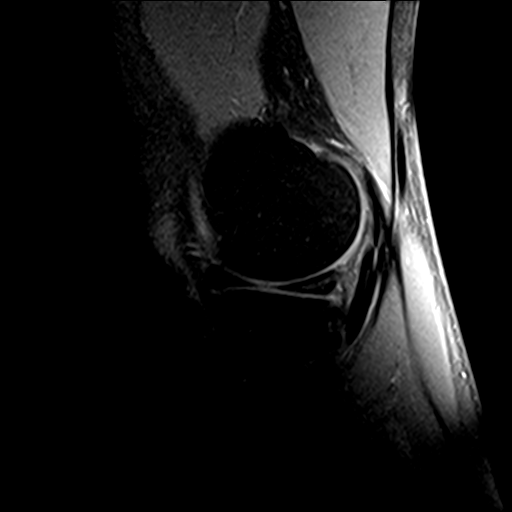
[im 26/26]
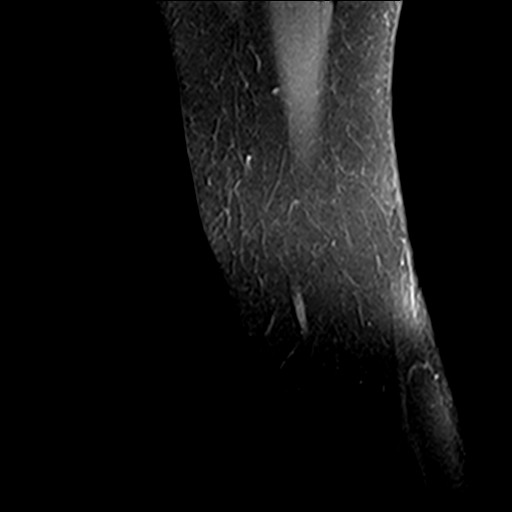

[Series 8: PD fat-sat · coronal · 3.0mm · 0.29mm/px · 6 of 26 slices shown (2 of 3)]
[im 1/26]
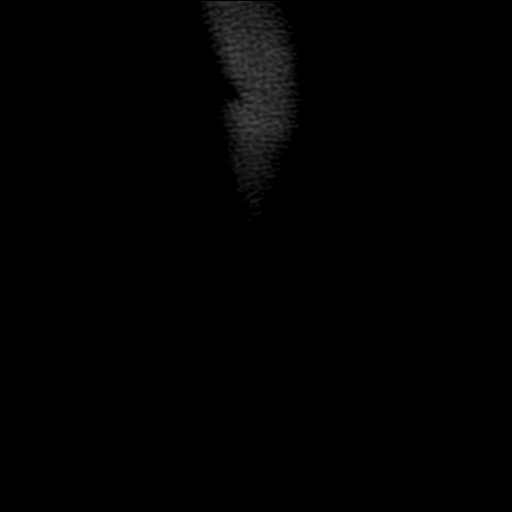
[im 5/26]
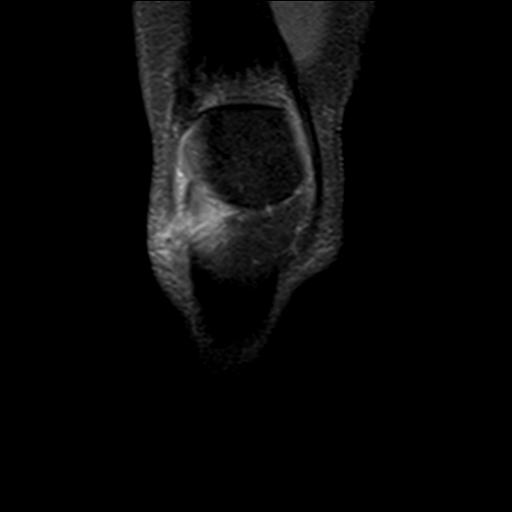
[im 9/26]
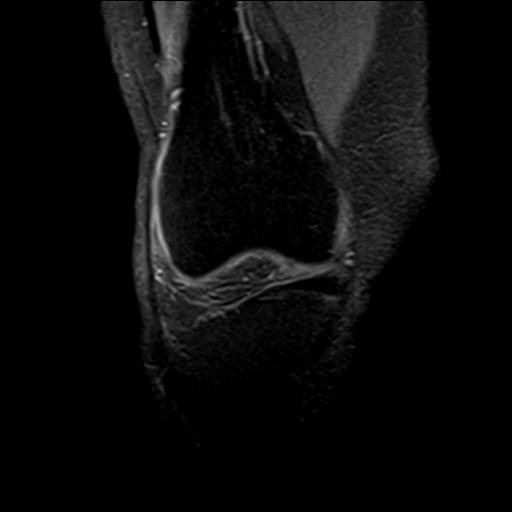
[im 13/26]
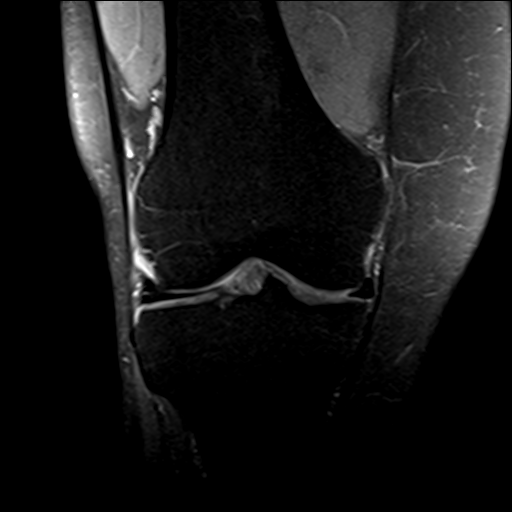
[im 17/26]
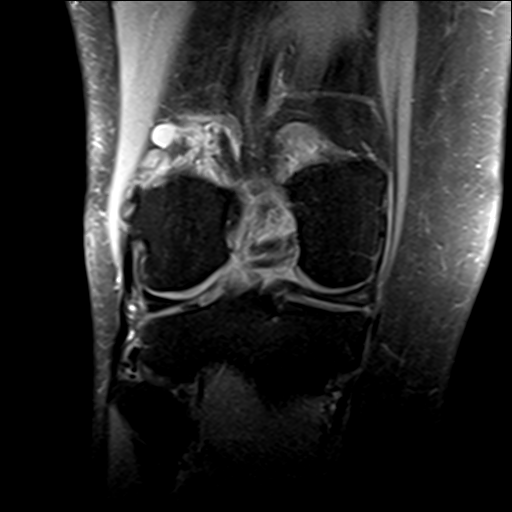
[im 21/26]
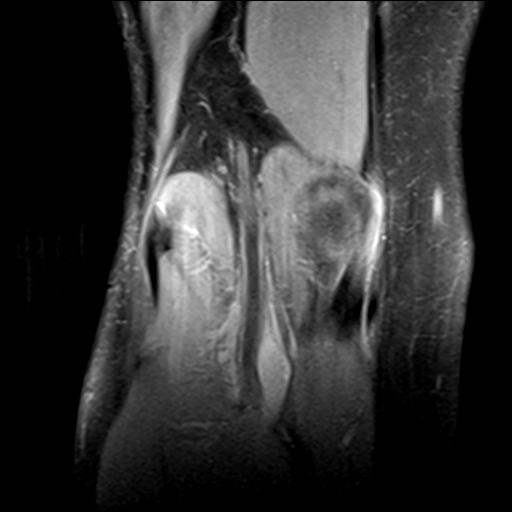

[Series 9: PD fat-sat · oblique · 2.3mm · 0.29mm/px · 3 of 11 slices shown (3 of 3)]
[im 1/11]
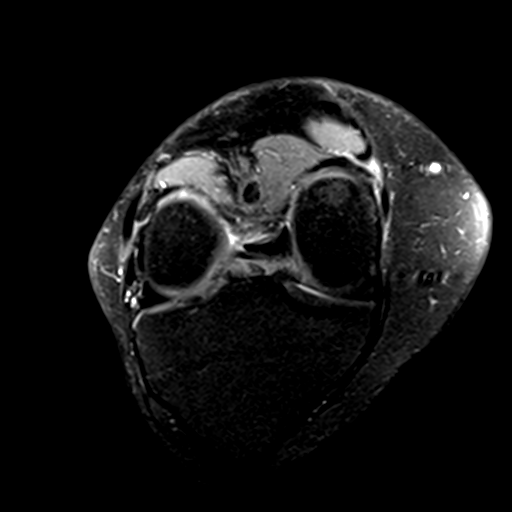
[im 6/11]
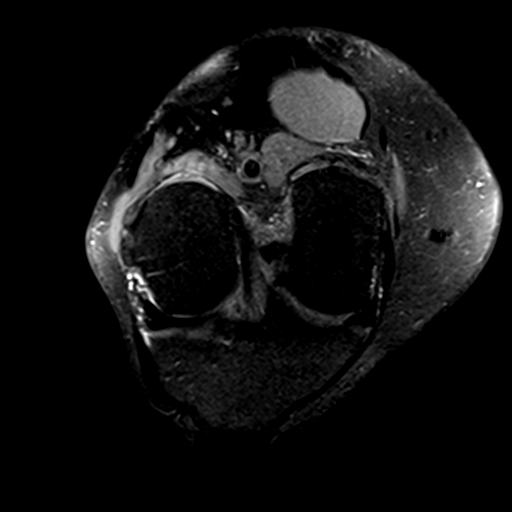
[im 11/11]
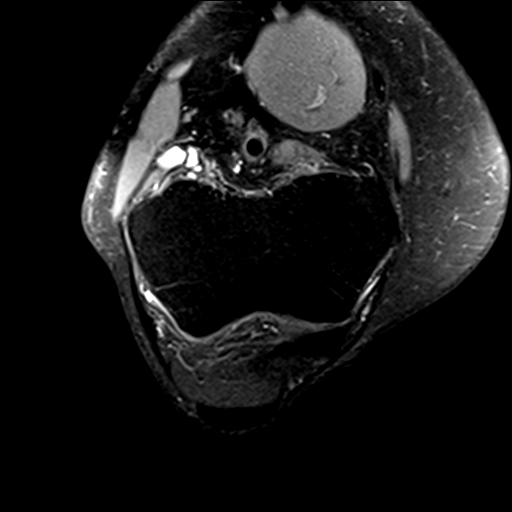

[19 of 40 positions shown; findings below may reference images not displayed]

FINDINGS: MENISCI

Medial meniscus:  Intact.

Lateral meniscus:  Intact.

LIGAMENTS

Cruciates:  Intact ACL and PCL.

Collaterals: Medial collateral ligament is intact. Lateral
collateral ligament complex is intact.

CARTILAGE

Patellofemoral: Scattered partial-thickness cartilage loss over the
patella.

Medial:  No chondral defect.

Lateral: Superficial irregularity and fissuring over the central
lateral tibial plateau. No chondral defect.

Joint: Trace joint effusion. Edema within the superolateral aspect
of Hoffa's fat.

Popliteal Fossa:  Tiny Baker cyst.  Intact popliteus tendon.

Extensor Mechanism: Intact quadriceps tendon and patellar tendon.
Intact medial and lateral patellar retinaculum. Intact MPFL.

Bones: No focal marrow signal abnormality. No fracture or
dislocation.

Other: 13 x 6 x 7 mm ganglion cyst along the posterior lateral
aspect of the distal femoral metaphysis (series 3, image 7).
IMPRESSION: 1. No meniscal or ligamentous injury.
2. Mild patellofemoral compartment osteoarthritis.
3. Edema within the superolateral aspect of Hoffa's fat pad, which
can be seen in the setting of patellar tendon-lateral femoral
condyle friction syndrome.

ADDENDUM:
Upon further review, there is likely a small longitudinal
meniscocapsular tear of the lateral meniscus at the body/posterior
horn junction (series 5, images 8-9).

*** End of Addendum ***
FINDINGS: MENISCI

Medial meniscus:  Intact.

Lateral meniscus:  Intact.

LIGAMENTS

Cruciates:  Intact ACL and PCL.

Collaterals: Medial collateral ligament is intact. Lateral
collateral ligament complex is intact.

CARTILAGE

Patellofemoral: Scattered partial-thickness cartilage loss over the
patella.

Medial:  No chondral defect.

Lateral: Superficial irregularity and fissuring over the central
lateral tibial plateau. No chondral defect.

Joint: Trace joint effusion. Edema within the superolateral aspect
of Hoffa's fat.

Popliteal Fossa:  Tiny Baker cyst.  Intact popliteus tendon.

Extensor Mechanism: Intact quadriceps tendon and patellar tendon.
Intact medial and lateral patellar retinaculum. Intact MPFL.

Bones: No focal marrow signal abnormality. No fracture or
dislocation.

Other: 13 x 6 x 7 mm ganglion cyst along the posterior lateral
aspect of the distal femoral metaphysis (series 3, image 7).
IMPRESSION: 1. No meniscal or ligamentous injury.
2. Mild patellofemoral compartment osteoarthritis.
3. Edema within the superolateral aspect of Hoffa's fat pad, which
can be seen in the setting of patellar tendon-lateral femoral
condyle friction syndrome.

## 2023-03-21 ENCOUNTER — Other Ambulatory Visit (HOSPITAL_BASED_OUTPATIENT_CLINIC_OR_DEPARTMENT_OTHER): Payer: Self-pay

## 2023-05-02 ENCOUNTER — Other Ambulatory Visit (HOSPITAL_BASED_OUTPATIENT_CLINIC_OR_DEPARTMENT_OTHER): Payer: Self-pay

## 2023-05-02 MED ORDER — ROSUVASTATIN CALCIUM 20 MG PO TABS
20.0000 mg | ORAL_TABLET | Freq: Every day | ORAL | 3 refills | Status: DC
  Filled 2023-05-02 – 2023-08-22 (×2): qty 90, 90d supply, fill #0
  Filled 2023-11-16: qty 90, 90d supply, fill #1
  Filled 2024-02-11: qty 90, 90d supply, fill #2
  Filled 2024-04-28: qty 90, 90d supply, fill #3

## 2023-05-22 ENCOUNTER — Other Ambulatory Visit (HOSPITAL_BASED_OUTPATIENT_CLINIC_OR_DEPARTMENT_OTHER): Payer: Self-pay

## 2023-06-09 ENCOUNTER — Other Ambulatory Visit (HOSPITAL_BASED_OUTPATIENT_CLINIC_OR_DEPARTMENT_OTHER): Payer: Self-pay

## 2023-08-22 ENCOUNTER — Other Ambulatory Visit (HOSPITAL_BASED_OUTPATIENT_CLINIC_OR_DEPARTMENT_OTHER): Payer: Self-pay

## 2023-11-17 ENCOUNTER — Other Ambulatory Visit (HOSPITAL_BASED_OUTPATIENT_CLINIC_OR_DEPARTMENT_OTHER): Payer: Self-pay

## 2023-11-18 ENCOUNTER — Other Ambulatory Visit (HOSPITAL_BASED_OUTPATIENT_CLINIC_OR_DEPARTMENT_OTHER): Payer: Self-pay

## 2023-11-18 MED ORDER — EZETIMIBE 10 MG PO TABS
10.0000 mg | ORAL_TABLET | Freq: Every day | ORAL | 3 refills | Status: DC
  Filled 2023-11-18: qty 90, 90d supply, fill #0
  Filled 2024-02-11: qty 90, 90d supply, fill #1
  Filled 2024-04-28: qty 90, 90d supply, fill #2
  Filled 2024-07-31: qty 90, 90d supply, fill #3

## 2023-12-04 ENCOUNTER — Other Ambulatory Visit (HOSPITAL_BASED_OUTPATIENT_CLINIC_OR_DEPARTMENT_OTHER): Payer: Self-pay | Admitting: Internal Medicine

## 2023-12-04 DIAGNOSIS — Z1231 Encounter for screening mammogram for malignant neoplasm of breast: Secondary | ICD-10-CM

## 2024-01-19 ENCOUNTER — Encounter (HOSPITAL_BASED_OUTPATIENT_CLINIC_OR_DEPARTMENT_OTHER): Payer: Self-pay | Admitting: Radiology

## 2024-01-19 ENCOUNTER — Ambulatory Visit (HOSPITAL_BASED_OUTPATIENT_CLINIC_OR_DEPARTMENT_OTHER)
Admission: RE | Admit: 2024-01-19 | Discharge: 2024-01-19 | Disposition: A | Discharge status: Home / Self Care | Source: Ambulatory Visit | Attending: Internal Medicine | Admitting: Internal Medicine

## 2024-01-19 DIAGNOSIS — Z1231 Encounter for screening mammogram for malignant neoplasm of breast: Secondary | ICD-10-CM | POA: Diagnosis present

## 2024-02-11 ENCOUNTER — Other Ambulatory Visit (HOSPITAL_BASED_OUTPATIENT_CLINIC_OR_DEPARTMENT_OTHER): Payer: Self-pay

## 2024-04-16 ENCOUNTER — Other Ambulatory Visit (HOSPITAL_BASED_OUTPATIENT_CLINIC_OR_DEPARTMENT_OTHER): Payer: Self-pay

## 2024-04-16 MED ORDER — PAROXETINE HCL 10 MG PO TABS
10.0000 mg | ORAL_TABLET | Freq: Every day | ORAL | 2 refills | Status: AC
  Filled 2024-04-16: qty 100, 100d supply, fill #0

## 2024-04-30 ENCOUNTER — Other Ambulatory Visit (HOSPITAL_BASED_OUTPATIENT_CLINIC_OR_DEPARTMENT_OTHER): Payer: Self-pay

## 2024-05-12 ENCOUNTER — Other Ambulatory Visit (HOSPITAL_BASED_OUTPATIENT_CLINIC_OR_DEPARTMENT_OTHER): Payer: Self-pay

## 2024-05-12 MED ORDER — TRETINOIN 0.05 % EX CREA
TOPICAL_CREAM | CUTANEOUS | 3 refills | Status: AC
  Filled 2024-05-12: qty 20, 14d supply, fill #0
  Filled 2024-05-18: qty 20, 30d supply, fill #0

## 2024-05-14 ENCOUNTER — Other Ambulatory Visit (HOSPITAL_BASED_OUTPATIENT_CLINIC_OR_DEPARTMENT_OTHER): Payer: Self-pay

## 2024-05-14 MED ORDER — PAROXETINE HCL 10 MG PO TABS
15.0000 mg | ORAL_TABLET | Freq: Every day | ORAL | 3 refills | Status: AC
  Filled 2024-05-14 – 2024-06-14 (×2): qty 150, 100d supply, fill #0
  Filled 2024-09-24: qty 150, 100d supply, fill #1

## 2024-05-18 ENCOUNTER — Other Ambulatory Visit (HOSPITAL_BASED_OUTPATIENT_CLINIC_OR_DEPARTMENT_OTHER): Payer: Self-pay

## 2024-05-18 ENCOUNTER — Other Ambulatory Visit: Payer: Self-pay

## 2024-05-18 ENCOUNTER — Other Ambulatory Visit (HOSPITAL_BASED_OUTPATIENT_CLINIC_OR_DEPARTMENT_OTHER): Payer: Self-pay | Admitting: Internal Medicine

## 2024-05-18 DIAGNOSIS — M8589 Other specified disorders of bone density and structure, multiple sites: Secondary | ICD-10-CM

## 2024-05-20 ENCOUNTER — Other Ambulatory Visit (HOSPITAL_BASED_OUTPATIENT_CLINIC_OR_DEPARTMENT_OTHER): Payer: Self-pay

## 2024-06-14 ENCOUNTER — Other Ambulatory Visit (HOSPITAL_BASED_OUTPATIENT_CLINIC_OR_DEPARTMENT_OTHER): Payer: Self-pay

## 2024-06-14 ENCOUNTER — Other Ambulatory Visit: Payer: Self-pay

## 2024-07-31 ENCOUNTER — Other Ambulatory Visit (HOSPITAL_BASED_OUTPATIENT_CLINIC_OR_DEPARTMENT_OTHER): Payer: Self-pay

## 2024-08-02 ENCOUNTER — Other Ambulatory Visit (HOSPITAL_BASED_OUTPATIENT_CLINIC_OR_DEPARTMENT_OTHER): Payer: Self-pay

## 2024-08-02 ENCOUNTER — Other Ambulatory Visit: Payer: Self-pay

## 2024-08-02 MED ORDER — ROSUVASTATIN CALCIUM 20 MG PO TABS
20.0000 mg | ORAL_TABLET | Freq: Every day | ORAL | 3 refills | Status: AC
  Filled 2024-08-02: qty 90, 90d supply, fill #0

## 2024-09-24 ENCOUNTER — Other Ambulatory Visit (HOSPITAL_BASED_OUTPATIENT_CLINIC_OR_DEPARTMENT_OTHER): Payer: Self-pay

## 2024-09-24 MED ORDER — EZETIMIBE 10 MG PO TABS
10.0000 mg | ORAL_TABLET | Freq: Every day | ORAL | 3 refills | Status: AC
  Filled 2024-09-24: qty 90, 90d supply, fill #0

## 2025-03-02 ENCOUNTER — Ambulatory Visit (HOSPITAL_BASED_OUTPATIENT_CLINIC_OR_DEPARTMENT_OTHER): Payer: Self-pay | Admitting: Obstetrics & Gynecology
